# Patient Record
Sex: Female | Born: 1960
Health system: Southern US, Community
[De-identification: ages and names within clinical notes are randomized; demographics above are authoritative.]

## PROBLEM LIST (undated history)

## (undated) DIAGNOSIS — J45909 Unspecified asthma, uncomplicated: Secondary | ICD-10-CM

## (undated) HISTORY — PX: FOOT SURGERY: SHX648

---

## 2007-06-07 ENCOUNTER — Other Ambulatory Visit: Payer: Self-pay

## 2007-06-07 ENCOUNTER — Inpatient Hospital Stay: Payer: Self-pay | Admitting: *Deleted

## 2009-10-27 ENCOUNTER — Ambulatory Visit: Payer: Self-pay | Admitting: Anesthesiology

## 2009-11-01 ENCOUNTER — Ambulatory Visit: Payer: Self-pay | Admitting: Podiatry

## 2013-04-19 ENCOUNTER — Ambulatory Visit: Payer: Self-pay | Admitting: Physician Assistant

## 2013-04-19 LAB — RAPID INFLUENZA A&B ANTIGENS

## 2016-05-21 ENCOUNTER — Other Ambulatory Visit: Payer: Self-pay | Admitting: Orthopedic Surgery

## 2016-05-21 DIAGNOSIS — M25562 Pain in left knee: Secondary | ICD-10-CM

## 2016-06-20 ENCOUNTER — Other Ambulatory Visit: Payer: Self-pay | Admitting: Internal Medicine

## 2016-06-20 DIAGNOSIS — R05 Cough: Secondary | ICD-10-CM

## 2016-06-20 DIAGNOSIS — R059 Cough, unspecified: Secondary | ICD-10-CM

## 2016-07-04 ENCOUNTER — Ambulatory Visit: Payer: 59

## 2016-07-09 ENCOUNTER — Ambulatory Visit
Admission: RE | Admit: 2016-07-09 | Discharge: 2016-07-09 | Disposition: A | Discharge status: Home / Self Care | Payer: Commercial Managed Care - HMO | Source: Ambulatory Visit | Attending: Internal Medicine | Admitting: Internal Medicine

## 2016-07-09 DIAGNOSIS — R918 Other nonspecific abnormal finding of lung field: Secondary | ICD-10-CM | POA: Diagnosis not present

## 2016-07-09 DIAGNOSIS — R0602 Shortness of breath: Secondary | ICD-10-CM | POA: Insufficient documentation

## 2016-07-09 DIAGNOSIS — R059 Cough, unspecified: Secondary | ICD-10-CM

## 2016-07-09 DIAGNOSIS — R05 Cough: Secondary | ICD-10-CM | POA: Insufficient documentation

## 2016-07-09 HISTORY — DX: Unspecified asthma, uncomplicated: J45.909

## 2016-07-09 MED ORDER — IOPAMIDOL (ISOVUE-300) INJECTION 61%
75.0000 mL | Freq: Once | INTRAVENOUS | Status: AC | PRN
  Administered 2016-07-09: 75 mL via INTRAVENOUS

## 2016-08-29 ENCOUNTER — Other Ambulatory Visit: Payer: Self-pay | Admitting: Internal Medicine

## 2016-08-29 DIAGNOSIS — R911 Solitary pulmonary nodule: Secondary | ICD-10-CM

## 2016-09-18 ENCOUNTER — Ambulatory Visit
Admission: RE | Admit: 2016-09-18 | Discharge: 2016-09-18 | Disposition: A | Discharge status: Home / Self Care | Payer: 59 | Source: Ambulatory Visit | Attending: Internal Medicine | Admitting: Internal Medicine

## 2016-10-01 ENCOUNTER — Encounter: Payer: Self-pay | Admitting: Radiology

## 2016-10-01 ENCOUNTER — Ambulatory Visit
Admission: RE | Admit: 2016-10-01 | Discharge: 2016-10-01 | Disposition: A | Discharge status: Home / Self Care | Payer: Commercial Managed Care - HMO | Source: Ambulatory Visit | Attending: Internal Medicine | Admitting: Internal Medicine

## 2016-10-01 DIAGNOSIS — R911 Solitary pulmonary nodule: Secondary | ICD-10-CM | POA: Diagnosis present

## 2016-10-01 DIAGNOSIS — R918 Other nonspecific abnormal finding of lung field: Secondary | ICD-10-CM | POA: Insufficient documentation

## 2016-10-01 MED ORDER — IOPAMIDOL (ISOVUE-300) INJECTION 61%
75.0000 mL | Freq: Once | INTRAVENOUS | Status: AC | PRN
  Administered 2016-10-01: 75 mL via INTRAVENOUS

## 2017-04-15 ENCOUNTER — Ambulatory Visit
Admission: RE | Admit: 2017-04-15 | Discharge: 2017-04-15 | Disposition: A | Discharge status: Home / Self Care | Payer: 59 | Source: Ambulatory Visit | Attending: Family Medicine | Admitting: Family Medicine

## 2017-04-15 ENCOUNTER — Other Ambulatory Visit: Payer: Self-pay | Admitting: Family Medicine

## 2017-04-15 DIAGNOSIS — M79671 Pain in right foot: Secondary | ICD-10-CM

## 2017-04-15 DIAGNOSIS — S92354A Nondisplaced fracture of fifth metatarsal bone, right foot, initial encounter for closed fracture: Secondary | ICD-10-CM | POA: Diagnosis not present

## 2017-04-15 DIAGNOSIS — X58XXXA Exposure to other specified factors, initial encounter: Secondary | ICD-10-CM | POA: Insufficient documentation

## 2017-05-06 ENCOUNTER — Ambulatory Visit
Admission: RE | Admit: 2017-05-06 | Discharge: 2017-05-06 | Disposition: A | Discharge status: Home / Self Care | Payer: 59 | Source: Ambulatory Visit | Attending: Family Medicine | Admitting: Family Medicine

## 2017-05-06 ENCOUNTER — Other Ambulatory Visit: Payer: Self-pay | Admitting: Family Medicine

## 2017-05-06 DIAGNOSIS — M79671 Pain in right foot: Secondary | ICD-10-CM | POA: Diagnosis not present

## 2017-05-06 DIAGNOSIS — X58XXXA Exposure to other specified factors, initial encounter: Secondary | ICD-10-CM | POA: Diagnosis not present

## 2017-05-06 DIAGNOSIS — S92351A Displaced fracture of fifth metatarsal bone, right foot, initial encounter for closed fracture: Secondary | ICD-10-CM | POA: Diagnosis not present

## 2017-06-05 ENCOUNTER — Ambulatory Visit: Payer: Self-pay | Admitting: Internal Medicine

## 2017-06-26 ENCOUNTER — Ambulatory Visit: Payer: 59 | Admitting: Internal Medicine

## 2017-06-26 ENCOUNTER — Encounter: Payer: Self-pay | Admitting: Internal Medicine

## 2017-06-26 VITALS — BP 129/78 | HR 81 | Resp 16 | Ht 68.0 in | Wt 170.1 lb

## 2017-06-26 DIAGNOSIS — J449 Chronic obstructive pulmonary disease, unspecified: Secondary | ICD-10-CM | POA: Diagnosis not present

## 2017-06-26 DIAGNOSIS — R911 Solitary pulmonary nodule: Secondary | ICD-10-CM

## 2017-06-26 DIAGNOSIS — R0602 Shortness of breath: Secondary | ICD-10-CM

## 2017-06-26 NOTE — Progress Notes (Signed)
[facility] [address] [place], [place] [address]  Pulmonary Sleep Medicine  Office Visit Note  Patient Name: Raven Ayers DOB: [birth year] MRN [id number]  Date of Service: 06/26/2017  Complaints/HPI:  She states that she is doing well she has been using the inhalers as prescribed she is currently on trilogy.  She has had no issues with side effects.  Once again we spoke about doing a complete pulmonary function studies she does not want to do this.  She did have a spirometry done here in the office that shows mild reduction in the FEV1 sewed BP consistent with mild obstructive lung disease.  She currently is not smoking.  She has little bit of a cough occasional shortness of breath this noted.  ROS  General: (-) fever, (-) chills, (-) night sweats, (-) weakness Skin: (-) rashes, (-) itching,. Eyes: (-) visual changes, (-) redness, (-) itching. Nose and Sinuses: (-) nasal stuffiness or itchiness, (-) postnasal drip, (-) nosebleeds, (-) sinus trouble. Mouth and Throat: (-) sore throat, (-) hoarseness. Neck: (-) swollen glands, (-) enlarged thyroid, (-) neck pain. Respiratory: + cough, (-) bloody sputum, + shortness of breath, - wheezing. Cardiovascular: - ankle swelling, (-) chest pain. Lymphatic: (-) lymph node enlargement. Neurologic: (-) numbness, (-) tingling. Psychiatric: (-) anxiety, (-) depression   Current Medication: Outpatient Encounter Medications as of 06/26/2017  Medication Sig  . albuterol (VENTOLIN HFA) 108 (90 Base) MCG/ACT inhaler INHALE 2 PUFFS PO Q 4 H PRF WHEEZING.  Marland Kitchen Fluticasone-Umeclidin-Vilant (TRELEGY ELLIPTA) 100-62.5-25 MCG/INH AEPB    No facility-administered encounter medications on file as of 06/26/2017.     Surgical History: Past Surgical History:  Procedure Laterality Date  . FOOT SURGERY Left     Medical History: Past Medical History:  Diagnosis Date  . Asthma     Family History: No family history on file.  Social  History: Social History   Socioeconomic History  . Marital status: Widowed    Spouse name: Not on file  . Number of children: Not on file  . Years of education: Not on file  . Highest education level: Not on file  Occupational History  . Not on file  Social Needs  . Financial resource strain: Not on file  . Food insecurity:    Worry: Not on file    Inability: Not on file  . Transportation needs:    Medical: Not on file    Non-medical: Not on file  Tobacco Use  . Smoking status: Former Smoker    Types: Cigarettes  . Smokeless tobacco: Never Used  . Tobacco comment: 10 years pt quit  Substance and Sexual Activity  . Alcohol use: Yes    Comment: social  . Drug use: Not on file  . Sexual activity: Not on file  Lifestyle  . Physical activity:    Days per week: Not on file    Minutes per session: Not on file  . Stress: Not on file  Relationships  . Social connections:    Talks on phone: Not on file    Gets together: Not on file    Attends religious service: Not on file    Active member of club or organization: Not on file    Attends meetings of clubs or organizations: Not on file    Relationship status: Not on file  . Intimate partner violence:    Fear of current or ex partner: Not on file    Emotionally abused: Not on file  Physically abused: Not on file    Forced sexual activity: Not on file  Other Topics Concern  . Not on file  Social History Narrative  . Not on file    Vital Signs: Blood pressure 129/78, pulse 81, resp. rate 16, height 5\' 8"  (1.727 m), weight 170 lb 1.6 oz (77.2 kg), SpO2 96 %, unknown if currently breastfeeding.  Examination: General Appearance: The patient is well-developed, well-nourished, and in no distress. Skin: Gross inspection of skin unremarkable. Head: normocephalic, no gross deformities. Eyes: no gross deformities noted. ENT: ears appear grossly normal no exudates. Neck: Supple. No thyromegaly. No LAD. Respiratory: clear at  this time. Cardiovascular: Normal S1 and S2 without murmur or rub. Extremities: No cyanosis. pulses are equal. Neurologic: Alert and oriented. No involuntary movements.  LABS: No results found for this or any previous visit (from the past 2160 hour(s)).  Radiology: Dg Foot Complete Right  Result Date: 05/06/2017 CLINICAL DATA:  Follow-up foot fracture EXAM: RIGHT FOOT COMPLETE - 3+ VIEW COMPARISON:  None. FINDINGS: Healing fracture at the base of the 5th metatarsal. Fracture lucency remains minimally visible. Hallux valgus deformity with degenerative changes of the 1st MTP joint. Visualized soft tissues are within normal limits. IMPRESSION: Healing fracture at the base of the 5th metatarsal. Fracture lucency remains minimally visible. Electronically Signed   By: Charline BillsSriyesh  Krishnan M.D.   On: 05/06/2017 17:09    No results found.  No results found.    Assessment and Plan: There are no active problems to display for this patient.   1.   COPD we will continue with full supportive care continue with trilogy as ordered 2. Cough at baseline not really any worse 3. Solitary pulmonary nodule this has resolved on the last CT scan will continue to monitor periodically 4. Remote tobacco  General Counseling: I have discussed the findings of the evaluation and examination with Lupita LeashDonna.  I have also discussed any further diagnostic evaluation thatmay be needed or ordered today. Lupita LeashDonna verbalizes understanding of the findings of todays visit. We also reviewed her medications today and discussed drug interactions and side effects including but not limited excessive drowsiness and altered mental states. We also discussed that there is always a risk not just to her but also people around her. she has been encouraged to call the office with any questions or concerns that should arise related to todays visit.    Time spent:  15 min  I have personally obtained a history, examined the patient, evaluated  laboratory and imaging results, formulated the assessment and plan and placed orders.    Yevonne PaxSaadat A Czar Ysaguirre, MD [facility] Pulmonary and Critical Care Sleep medicine

## 2017-06-26 NOTE — Patient Instructions (Signed)

## 2017-11-11 ENCOUNTER — Other Ambulatory Visit: Payer: Self-pay | Admitting: Internal Medicine

## 2017-12-29 ENCOUNTER — Ambulatory Visit: Payer: Self-pay | Admitting: Internal Medicine

## 2018-01-19 ENCOUNTER — Ambulatory Visit: Payer: 59 | Admitting: Adult Health

## 2018-01-19 ENCOUNTER — Encounter: Payer: Self-pay | Admitting: Adult Health

## 2018-01-19 VITALS — BP 124/68 | HR 84 | Resp 16 | Ht 66.0 in | Wt 170.0 lb

## 2018-01-19 DIAGNOSIS — R0602 Shortness of breath: Secondary | ICD-10-CM | POA: Diagnosis not present

## 2018-01-19 DIAGNOSIS — I10 Essential (primary) hypertension: Secondary | ICD-10-CM

## 2018-01-19 DIAGNOSIS — J449 Chronic obstructive pulmonary disease, unspecified: Secondary | ICD-10-CM

## 2018-01-19 DIAGNOSIS — Z23 Encounter for immunization: Secondary | ICD-10-CM | POA: Diagnosis not present

## 2018-01-19 MED ORDER — FLUTICASONE-UMECLIDIN-VILANT 100-62.5-25 MCG/INH IN AEPB: 1.0000 | INHALATION_SPRAY | Freq: Every day | RESPIRATORY_TRACT | 6 refills | Status: DC

## 2018-01-19 MED ORDER — ALBUTEROL SULFATE HFA 108 (90 BASE) MCG/ACT IN AERS: 2.0000 | INHALATION_SPRAY | Freq: Four times a day (QID) | RESPIRATORY_TRACT | 6 refills | Status: DC | PRN

## 2018-01-19 NOTE — Progress Notes (Signed)
[facility] [address] [place], [place] [address]  Pulmonary Sleep Medicine   Office Visit Note  Patient Name: Raven Ayers DOB: 1961-02-06 MRN [id number]  Date of Service: 01/19/2018  Complaints/HPI: Patient here for six-month pulmonary follow-up.  She reports he general been doing well.  She continues to use her inhaler as prescribed.  She reports not having to use her rescue inhaler very often, mostly with weather changes.  Some treatment in the office today she continues to show mild reduction in FEV1, mild obstructive lung disease.  She quit smoking 10 years ago and started back.  She does continue to report a small of coughing but denies fever or shortness of breath at this time.  ROS  General: (-) fever, (-) chills, (-) night sweats, (-) weakness Skin: (-) rashes, (-) itching,. Eyes: (-) visual changes, (-) redness, (-) itching. Nose and Sinuses: (-) nasal stuffiness or itchiness, (-) postnasal drip, (-) nosebleeds, (-) sinus trouble. Mouth and Throat: (-) sore throat, (-) hoarseness. Neck: (-) swollen glands, (-) enlarged thyroid, (-) neck pain. Respiratory: + cough, (-) bloody sputum, - shortness of breath, - wheezing. Cardiovascular: - ankle swelling, (-) chest pain. Lymphatic: (-) lymph node enlargement. Neurologic: (-) numbness, (-) tingling. Psychiatric: (-) anxiety, (-) depression   Current Medication: Outpatient Encounter Medications as of 01/19/2018  Medication Sig  . albuterol (VENTOLIN HFA) 108 (90 Base) MCG/ACT inhaler Inhale 2 puffs into the lungs every 6 (six) hours as needed for wheezing or shortness of breath.  . Fluticasone-Umeclidin-Vilant (TRELEGY ELLIPTA) 100-62.5-25 MCG/INH AEPB Inhale 1 puff into the lungs daily.  . [DISCONTINUED] albuterol (VENTOLIN HFA) 108 (90 Base) MCG/ACT inhaler INHALE 2 PUFFS PO Q 4 H PRF WHEEZING.  . [DISCONTINUED] TRELEGY ELLIPTA 100-62.5-25 MCG/INH AEPB INHALE 1 PUFF BY MOUTH DAILY   No  facility-administered encounter medications on file as of 01/19/2018.     Surgical History: Past Surgical History:  Procedure Laterality Date  . FOOT SURGERY Left     Medical History: Past Medical History:  Diagnosis Date  . Asthma     Family History: Family History  Problem Relation Age of Onset  . Cancer Mother   . Alzheimer's disease Father     Social History: Social History   Socioeconomic History  . Marital status: Widowed    Spouse name: Not on file  . Number of children: Not on file  . Years of education: Not on file  . Highest education level: Not on file  Occupational History  . Not on file  Social Needs  . Financial resource strain: Not on file  . Food insecurity:    Worry: Not on file    Inability: Not on file  . Transportation needs:    Medical: Not on file    Non-medical: Not on file  Tobacco Use  . Smoking status: Former Smoker    Types: Cigarettes  . Smokeless tobacco: Never Used  . Tobacco comment: 10 years pt quit  Substance and Sexual Activity  . Alcohol use: Yes    Comment: social  . Drug use: Not on file  . Sexual activity: Not on file  Lifestyle  . Physical activity:    Days per week: Not on file    Minutes per session: Not on file  . Stress: Not on file  Relationships  . Social connections:    Talks on phone: Not on file    Gets together: Not on file    Attends religious service: Not on file  Active member of club or organization: Not on file    Attends meetings of clubs or organizations: Not on file    Relationship status: Not on file  . Intimate partner violence:    Fear of current or ex partner: Not on file    Emotionally abused: Not on file    Physically abused: Not on file    Forced sexual activity: Not on file  Other Topics Concern  . Not on file  Social History Narrative  . Not on file    Vital Signs: Blood pressure 124/68, pulse 84, resp. rate 16, height 5\' 6"  (1.676 m), weight 170 lb (77.1 kg), SpO2 92 %,  unknown if currently breastfeeding.  Examination: General Appearance: The patient is well-developed, well-nourished, and in no distress. Skin: Gross inspection of skin unremarkable. Head: normocephalic, no gross deformities. Eyes: no gross deformities noted. ENT: ears appear grossly normal no exudates. Neck: Supple. No thyromegaly. No LAD. Respiratory: Clear to auscultation bilaterally. Cardiovascular: Normal S1 and S2 without murmur or rub. Extremities: No cyanosis. pulses are equal. Neurologic: Alert and oriented. No involuntary movements.  LABS: No results found for this or any previous visit (from the past 2160 hour(s)).  Radiology: Dg Foot Complete Right  Result Date: 05/06/2017 CLINICAL DATA:  Follow-up foot fracture EXAM: RIGHT FOOT COMPLETE - 3+ VIEW COMPARISON:  None. FINDINGS: Healing fracture at the base of the 5th metatarsal. Fracture lucency remains minimally visible. Hallux valgus deformity with degenerative changes of the 1st MTP joint. Visualized soft tissues are within normal limits. IMPRESSION: Healing fracture at the base of the 5th metatarsal. Fracture lucency remains minimally visible. Electronically Signed   By: Charline Bills M.D.   On: 05/06/2017 17:09    No results found.  No results found.    Assessment and Plan: There are no active problems to display for this patient.   1. Chronic obstructive pulmonary disease, unspecified COPD type (HCC) Continue with current treatment regimen.  Discussed evaluating pulmonary function test with patient, she continues to decline at this time.  Refilled patient's inhalers. - Fluticasone-Umeclidin-Vilant (TRELEGY ELLIPTA) 100-62.5-25 MCG/INH AEPB; Inhale 1 puff into the lungs daily.  Dispense: 1 each; Refill: 6 - albuterol (VENTOLIN HFA) 108 (90 Base) MCG/ACT inhaler; Inhale 2 puffs into the lungs every 6 (six) hours as needed for wheezing or shortness of breath.  Dispense: 1 Inhaler; Refill: 6  2. Essential  hypertension Controlled currently, continue therapy.  3. Flu vaccine need - Flu Vaccine MDCK QUAD PF  4. SOB (shortness of breath) FEV1 FVC ratio had a pre-actual value 68% and a previous value of 86% on today's spirometry.  This is unchanged since last spirometry in March of this year. - Spirometry with Graph  General Counseling: I have discussed the findings of the evaluation and examination with Rochel.  I have also discussed any further diagnostic evaluation thatmay be needed or ordered today. Nathalee verbalizes understanding of the findings of todays visit. We also reviewed her medications today and discussed drug interactions and side effects including but not limited excessive drowsiness and altered mental states. We also discussed that there is always a risk not just to her but also people around her. she has been encouraged to call the office with any questions or concerns that should arise related to todays visit.    Time spent: 20 This patient was seen by Blima Ledger AGNP-C in Collaboration with Dr. Freda Munro as a part of collaborative care agreement.   I have personally obtained a  history, examined the patient, evaluated laboratory and imaging results, formulated the assessment and plan and placed orders.    Allyne Gee, MD [facility] Pulmonary and Critical Care Sleep medicine

## 2018-01-19 NOTE — Patient Instructions (Signed)

## 2018-07-21 ENCOUNTER — Ambulatory Visit: Payer: Self-pay | Admitting: Internal Medicine

## 2018-08-10 ENCOUNTER — Ambulatory Visit: Payer: Self-pay | Admitting: Nurse Practitioner

## 2018-08-10 ENCOUNTER — Other Ambulatory Visit: Payer: Self-pay | Admitting: Adult Health

## 2018-08-10 DIAGNOSIS — J449 Chronic obstructive pulmonary disease, unspecified: Secondary | ICD-10-CM

## 2018-09-21 ENCOUNTER — Other Ambulatory Visit: Payer: Self-pay | Admitting: Adult Health

## 2018-09-21 DIAGNOSIS — J449 Chronic obstructive pulmonary disease, unspecified: Secondary | ICD-10-CM

## 2018-09-28 ENCOUNTER — Encounter: Payer: Self-pay | Admitting: Internal Medicine

## 2018-09-28 ENCOUNTER — Other Ambulatory Visit: Payer: Self-pay

## 2018-09-28 ENCOUNTER — Ambulatory Visit: Payer: 59 | Admitting: Internal Medicine

## 2018-09-28 VITALS — Ht 66.0 in | Wt 166.1 lb

## 2018-09-28 DIAGNOSIS — I10 Essential (primary) hypertension: Secondary | ICD-10-CM

## 2018-09-28 DIAGNOSIS — J449 Chronic obstructive pulmonary disease, unspecified: Secondary | ICD-10-CM

## 2018-09-28 MED ORDER — TRELEGY ELLIPTA 100-62.5-25 MCG/INH IN AEPB: 1.0000 | INHALATION_SPRAY | Freq: Every day | RESPIRATORY_TRACT | 6 refills | Status: DC

## 2018-09-28 MED ORDER — ALBUTEROL SULFATE HFA 108 (90 BASE) MCG/ACT IN AERS: 2.0000 | INHALATION_SPRAY | Freq: Four times a day (QID) | RESPIRATORY_TRACT | 3 refills | Status: DC | PRN

## 2018-09-28 NOTE — Progress Notes (Signed)
[facility] [address] [place], [place] [id number]  Internal MEDICINE  Telephone Visit  Patient Name: Raven Ayers  [id number]  [id number]  Date of Service: 09/28/2018  I connected with the patient at 1200 by telephone and verified the patients identity using two identifiers.   I discussed the limitations, risks, security and privacy concerns of performing an evaluation and management service by telephone and the availability of in person appointments. I also discussed with the patient that there may be a patient responsible charge related to the service.  The patient expressed understanding and agrees to proceed.    Chief Complaint  Patient presents with  . Telephone Assessment  . Telephone Screen  . Follow-up  . Asthma    HPI Pt is seen via video for follow up.  She reports she is doing well.  Denies any current issues.  No hospitalizations, no fever, sob, cough or other symptoms. She is using her inhalers and is requesting refills at this time.     Current Medication: Outpatient Encounter Medications as of 09/28/2018  Medication Sig  . albuterol (VENTOLIN HFA) 108 (90 Base) MCG/ACT inhaler Inhale 2 puffs into the lungs every 6 (six) hours as needed for wheezing or shortness of breath.  . Fluticasone-Umeclidin-Vilant (TRELEGY ELLIPTA) 100-62.5-25 MCG/INH AEPB Inhale 1 puff into the lungs daily.  . [DISCONTINUED] albuterol (VENTOLIN HFA) 108 (90 Base) MCG/ACT inhaler Inhale 2 puffs into the lungs every 6 (six) hours as needed for wheezing or shortness of breath.  . [DISCONTINUED] TRELEGY ELLIPTA 100-62.5-25 MCG/INH AEPB INHALE 1 PUFF INTO THE LUNGS DAILY   No facility-administered encounter medications on file as of 09/28/2018.     Surgical History: Past Surgical History:  Procedure Laterality Date  . FOOT SURGERY Left     Medical History: Past Medical History:  Diagnosis Date  . Asthma     Family History: Family History  Problem Relation Age of Onset   . Cancer Mother   . Alzheimer's disease Father     Social History   Socioeconomic History  . Marital status: Widowed    Spouse name: Not on file  . Number of children: Not on file  . Years of education: Not on file  . Highest education level: Not on file  Occupational History  . Not on file  Social Needs  . Financial resource strain: Not on file  . Food insecurity    Worry: Not on file    Inability: Not on file  . Transportation needs    Medical: Not on file    Non-medical: Not on file  Tobacco Use  . Smoking status: Former Smoker    Types: Cigarettes  . Smokeless tobacco: Never Used  . Tobacco comment: 10 years pt quit  Substance and Sexual Activity  . Alcohol use: Yes    Comment: social  . Drug use: Not on file  . Sexual activity: Not on file  Lifestyle  . Physical activity    Days per week: Not on file    Minutes per session: Not on file  . Stress: Not on file  Relationships  . Social Musicianconnections    Talks on phone: Not on file    Gets together: Not on file    Attends religious service: Not on file    Active member of club or organization: Not on file    Attends meetings of clubs or organizations: Not on file    Relationship status: Not on file  . Intimate partner  violence    Fear of current or ex partner: Not on file    Emotionally abused: Not on file    Physically abused: Not on file    Forced sexual activity: Not on file  Other Topics Concern  . Not on file  Social History Narrative  . Not on file      Review of Systems  Constitutional: Negative for chills, fatigue and unexpected weight change.  HENT: Negative for congestion, rhinorrhea, sneezing and sore throat.   Eyes: Negative for photophobia, pain and redness.  Respiratory: Negative for cough, chest tightness and shortness of breath.   Cardiovascular: Negative for chest pain and palpitations.  Gastrointestinal: Negative for abdominal pain, constipation, diarrhea, nausea and vomiting.   Endocrine: Negative.   Genitourinary: Negative for dysuria and frequency.  Musculoskeletal: Negative for arthralgias, back pain, joint swelling and neck pain.  Skin: Negative for rash.  Allergic/Immunologic: Negative.   Neurological: Negative for tremors and numbness.  Hematological: Negative for adenopathy. Does not bruise/bleed easily.  Psychiatric/Behavioral: Negative for behavioral problems and sleep disturbance. The patient is not nervous/anxious.     Vital Signs: Ht 5\' 6"  (1.676 m)   Wt 166 lb 1.6 oz (75.3 kg)   BMI 26.81 kg/m    Observation/Objective: Well appearing, NAD at this time.     Assessment/Plan: 1. Chronic obstructive pulmonary disease, unspecified COPD type ([place]) Overall patient is doing well.  Refilled her inhalers at this time. We also discussed PFT's she has never had then done in our office.  She typically refuses due to her first experience in the hospital was so miserable.  We discussed the logistics of the new PFT machine,and she says she will think about it for her next visit.   - Fluticasone-Umeclidin-Vilant (TRELEGY ELLIPTA) 100-62.5-25 MCG/INH AEPB; Inhale 1 puff into the lungs daily.  Dispense: 60 each; Refill: 6 - albuterol (VENTOLIN HFA) 108 (90 Base) MCG/ACT inhaler; Inhale 2 puffs into the lungs every 6 (six) hours as needed for wheezing or shortness of breath.  Dispense: 54 g; Refill: 3  2. Essential hypertension Stable, continue present management.   General Counseling: rubie ficco understanding of the findings of today's phone visit and agrees with plan of treatment. I have discussed any further diagnostic evaluation that may be needed or ordered today. We also reviewed her medications today. she has been encouraged to call the office with any questions or concerns that should arise related to todays visit.    No orders of the defined types were placed in this encounter.   Meds ordered this encounter  Medications  .  Fluticasone-Umeclidin-Vilant (TRELEGY ELLIPTA) 100-62.5-25 MCG/INH AEPB    Sig: Inhale 1 puff into the lungs daily.    Dispense:  60 each    Refill:  6  . albuterol (VENTOLIN HFA) 108 (90 Base) MCG/ACT inhaler    Sig: Inhale 2 puffs into the lungs every 6 (six) hours as needed for wheezing or shortness of breath.    Dispense:  54 g    Refill:  3    3 inhalers, (90 day supply please)    Time spent: 12 Minutes    Orson Gear AGNP-C Internal medicine

## 2019-03-31 ENCOUNTER — Telehealth: Payer: Self-pay

## 2019-03-31 NOTE — Telephone Encounter (Signed)
Confirmed appointment with patient. klh °

## 2019-04-05 ENCOUNTER — Ambulatory Visit: Payer: 59 | Admitting: Internal Medicine

## 2019-04-05 ENCOUNTER — Other Ambulatory Visit: Payer: Self-pay

## 2019-04-05 ENCOUNTER — Encounter: Payer: Self-pay | Admitting: Internal Medicine

## 2019-04-05 VITALS — Ht 67.0 in | Wt 168.0 lb

## 2019-04-05 DIAGNOSIS — J449 Chronic obstructive pulmonary disease, unspecified: Secondary | ICD-10-CM

## 2019-04-05 DIAGNOSIS — I10 Essential (primary) hypertension: Secondary | ICD-10-CM | POA: Diagnosis not present

## 2019-04-05 MED ORDER — ALBUTEROL SULFATE HFA 108 (90 BASE) MCG/ACT IN AERS: 2.0000 | INHALATION_SPRAY | Freq: Four times a day (QID) | RESPIRATORY_TRACT | 3 refills | Status: DC | PRN

## 2019-04-05 MED ORDER — TRELEGY ELLIPTA 100-62.5-25 MCG/INH IN AEPB: 1.0000 | INHALATION_SPRAY | Freq: Every day | RESPIRATORY_TRACT | 6 refills | Status: DC

## 2019-04-05 NOTE — Progress Notes (Signed)
[facility] [place], [place] [address]  Internal MEDICINE  Telephone Visit  Patient Name: Raven Ayers  [id number]  [id number]  Date of Service: 04/05/2019  I connected with the patient at 1251 by telephone and verified the patients identity using two identifiers.   I discussed the limitations, risks, security and privacy concerns of performing an evaluation and management service by telephone and the availability of in person appointments. I also discussed with the patient that there may be a patient responsible charge related to the service.  The patient expressed understanding and agrees to proceed.    Chief Complaint  Patient presents with  . Telephone Assessment  . Telephone Screen  . Asthma    HPI  Pt is seen via video for follow up.  She reports she is doing well.   No hospitalizations, no fever, sob, cough or other symptoms. She is using her inhalers and is requesting refills at this time. She is currently using trelegy daily.  She reports using her rescue inhaler mostly when the weather changes, and at most she uses it every 3 days at that time.    Current Medication: Outpatient Encounter Medications as of 04/05/2019  Medication Sig  . albuterol (VENTOLIN HFA) 108 (90 Base) MCG/ACT inhaler Inhale 2 puffs into the lungs every 6 (six) hours as needed for wheezing or shortness of breath.  . Fluticasone-Umeclidin-Vilant (TRELEGY ELLIPTA) 100-62.5-25 MCG/INH AEPB Inhale 1 puff into the lungs daily.  . [DISCONTINUED] albuterol (VENTOLIN HFA) 108 (90 Base) MCG/ACT inhaler Inhale 2 puffs into the lungs every 6 (six) hours as needed for wheezing or shortness of breath.  . [DISCONTINUED] Fluticasone-Umeclidin-Vilant (TRELEGY ELLIPTA) 100-62.5-25 MCG/INH AEPB Inhale 1 puff into the lungs daily.   No facility-administered encounter medications on file as of 04/05/2019.    Surgical History: Past Surgical History:  Procedure Laterality Date  . FOOT  SURGERY Left     Medical History: Past Medical History:  Diagnosis Date  . Asthma     Family History: Family History  Problem Relation Age of Onset  . Cancer Mother   . Alzheimer's disease Father     Social History   Socioeconomic History  . Marital status: Widowed    Spouse name: Not on file  . Number of children: Not on file  . Years of education: Not on file  . Highest education level: Not on file  Occupational History  . Not on file  Tobacco Use  . Smoking status: Former Smoker    Types: Cigarettes  . Smokeless tobacco: Never Used  . Tobacco comment: 10 years pt quit  Substance and Sexual Activity  . Alcohol use: Yes    Comment: social  . Drug use: Never  . Sexual activity: Not on file  Other Topics Concern  . Not on file  Social History Narrative  . Not on file   Social Determinants of Health   Financial Resource Strain:   . Difficulty of Paying Living Expenses: Not on file  Food Insecurity:   . Worried About Charity fundraiser in the Last Year: Not on file  . Ran Out of Food in the Last Year: Not on file  Transportation Needs:   . Lack of Transportation (Medical): Not on file  . Lack of Transportation (Non-Medical): Not on file  Physical Activity:   . Days of Exercise per Week: Not on file  . Minutes of Exercise per Session: Not on file  Stress:   . Feeling  of Stress : Not on file  Social Connections:   . Frequency of Communication with Friends and Family: Not on file  . Frequency of Social Gatherings with Friends and Family: Not on file  . Attends Religious Services: Not on file  . Active Member of Clubs or Organizations: Not on file  . Attends Banker Meetings: Not on file  . Marital Status: Not on file  Intimate Partner Violence:   . Fear of Current or Ex-Partner: Not on file  . Emotionally Abused: Not on file  . Physically Abused: Not on file  . Sexually Abused: Not on file      Review of Systems  Constitutional:  Negative for chills, fatigue and unexpected weight change.  HENT: Negative for congestion, rhinorrhea, sneezing and sore throat.   Eyes: Negative for photophobia, pain and redness.  Respiratory: Negative for cough, chest tightness and shortness of breath.   Cardiovascular: Negative for chest pain and palpitations.  Gastrointestinal: Negative for abdominal pain, constipation, diarrhea, nausea and vomiting.  Endocrine: Negative.   Genitourinary: Negative for dysuria and frequency.  Musculoskeletal: Negative for arthralgias, back pain, joint swelling and neck pain.  Skin: Negative for rash.  Allergic/Immunologic: Negative.   Neurological: Negative for tremors and numbness.  Hematological: Negative for adenopathy. Does not bruise/bleed easily.  Psychiatric/Behavioral: Negative for behavioral problems and sleep disturbance. The patient is not nervous/anxious.     Vital Signs: Ht 5\' 7"  (1.702 m)   Wt 168 lb (76.2 kg)   BMI 26.31 kg/m    Observation/Objective:  Well sounding, NAD noted.    Assessment/Plan: 1. Chronic obstructive pulmonary disease, unspecified COPD type (HCC) Continue to use flonase and Albuterol as prescribed.  - Fluticasone-Umeclidin-Vilant (TRELEGY ELLIPTA) 100-62.5-25 MCG/INH AEPB; Inhale 1 puff into the lungs daily.  Dispense: 60 each; Refill: 6 - albuterol (VENTOLIN HFA) 108 (90 Base) MCG/ACT inhaler; Inhale 2 puffs into the lungs every 6 (six) hours as needed for wheezing or shortness of breath.  Dispense: 54 g; Refill: 3 - Pulmonary function test; Future  2. Essential hypertension Controlled, continue present management.   General Counseling: Raven Ayers understanding of the findings of today's phone visit and agrees with plan of treatment. I have discussed any further diagnostic evaluation that may be needed or ordered today. We also reviewed her medications today. she has been encouraged to call the office with any questions or concerns that should arise  related to todays visit.    Orders Placed This Encounter  Procedures  . Pulmonary function test    Meds ordered this encounter  Medications  . Fluticasone-Umeclidin-Vilant (TRELEGY ELLIPTA) 100-62.5-25 MCG/INH AEPB    Sig: Inhale 1 puff into the lungs daily.    Dispense:  60 each    Refill:  6  . albuterol (VENTOLIN HFA) 108 (90 Base) MCG/ACT inhaler    Sig: Inhale 2 puffs into the lungs every 6 (six) hours as needed for wheezing or shortness of breath.    Dispense:  54 g    Refill:  3    3 inhalers, (90 day supply please)    Time spent: 15 Minutes    05-07-1983 AGNP-C Pulmonary medicine

## 2019-04-19 ENCOUNTER — Telehealth: Payer: Self-pay

## 2019-04-19 NOTE — Telephone Encounter (Signed)
Patient cancelled appointment on 04/21/2019 due to new baby in family also stated does not want to reschedule does not want to do pft. klh

## 2019-04-21 ENCOUNTER — Ambulatory Visit: Payer: 59 | Admitting: Internal Medicine

## 2019-04-22 ENCOUNTER — Encounter: Payer: Self-pay | Admitting: Internal Medicine

## 2019-04-22 NOTE — Telephone Encounter (Signed)
Hey. I got this message in my inbox

## 2019-05-10 ENCOUNTER — Ambulatory Visit: Payer: 59 | Admitting: Internal Medicine

## 2019-05-16 IMAGING — CR DG FOOT 2V*R*
2 series · 2 of 2 positions shown · non-contrast
Comparison: None.

CLINICAL DATA: Hx fracture in base of 5th MT bone x 5 weeks ago.
F/u xray

EXAM:
RIGHT FOOT - 2 VIEW

[foot ap]
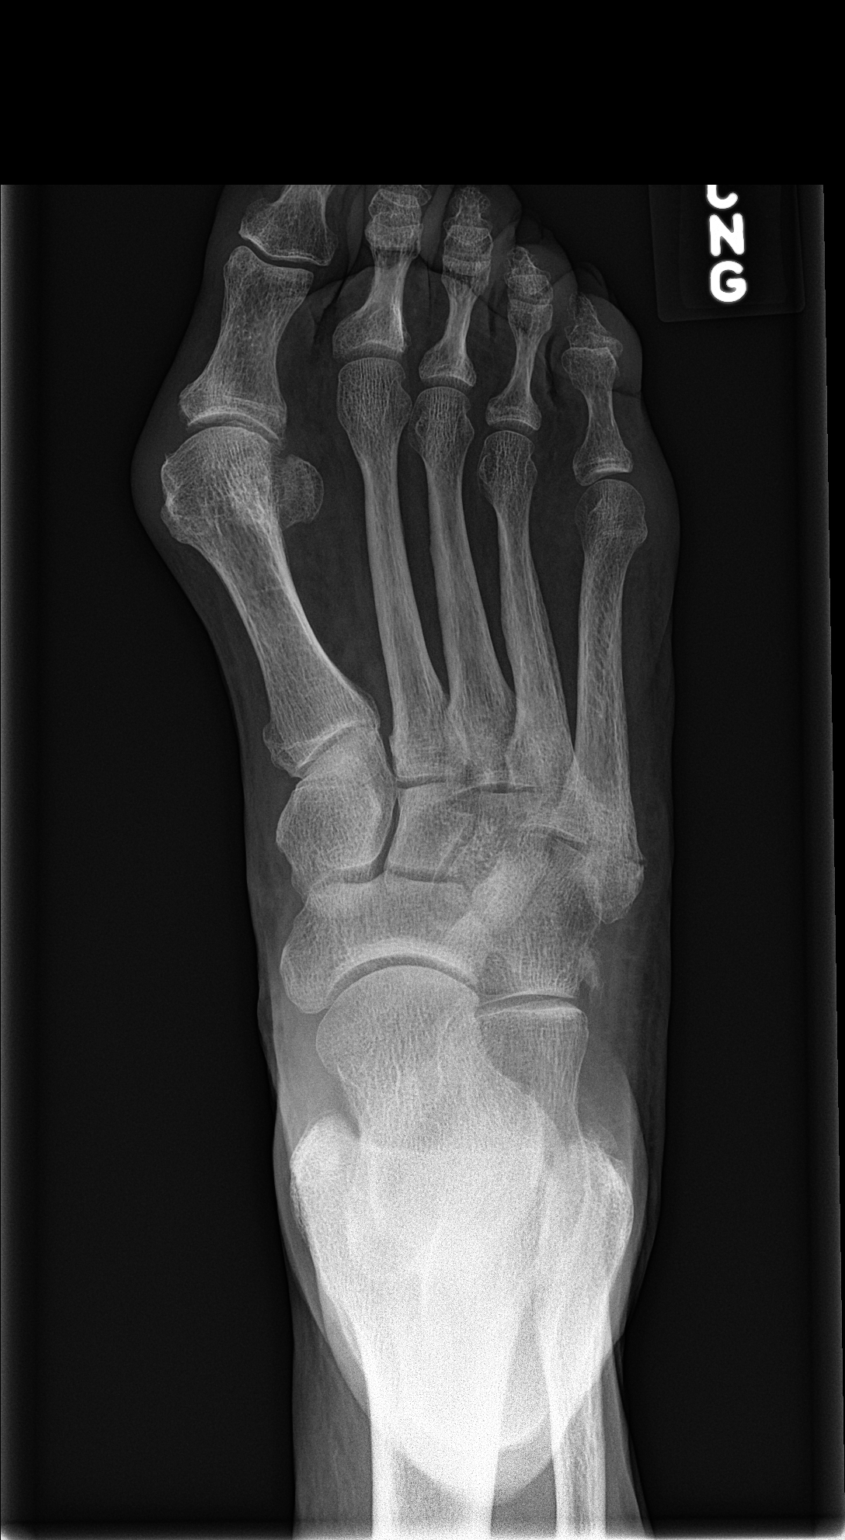

[foot lat]
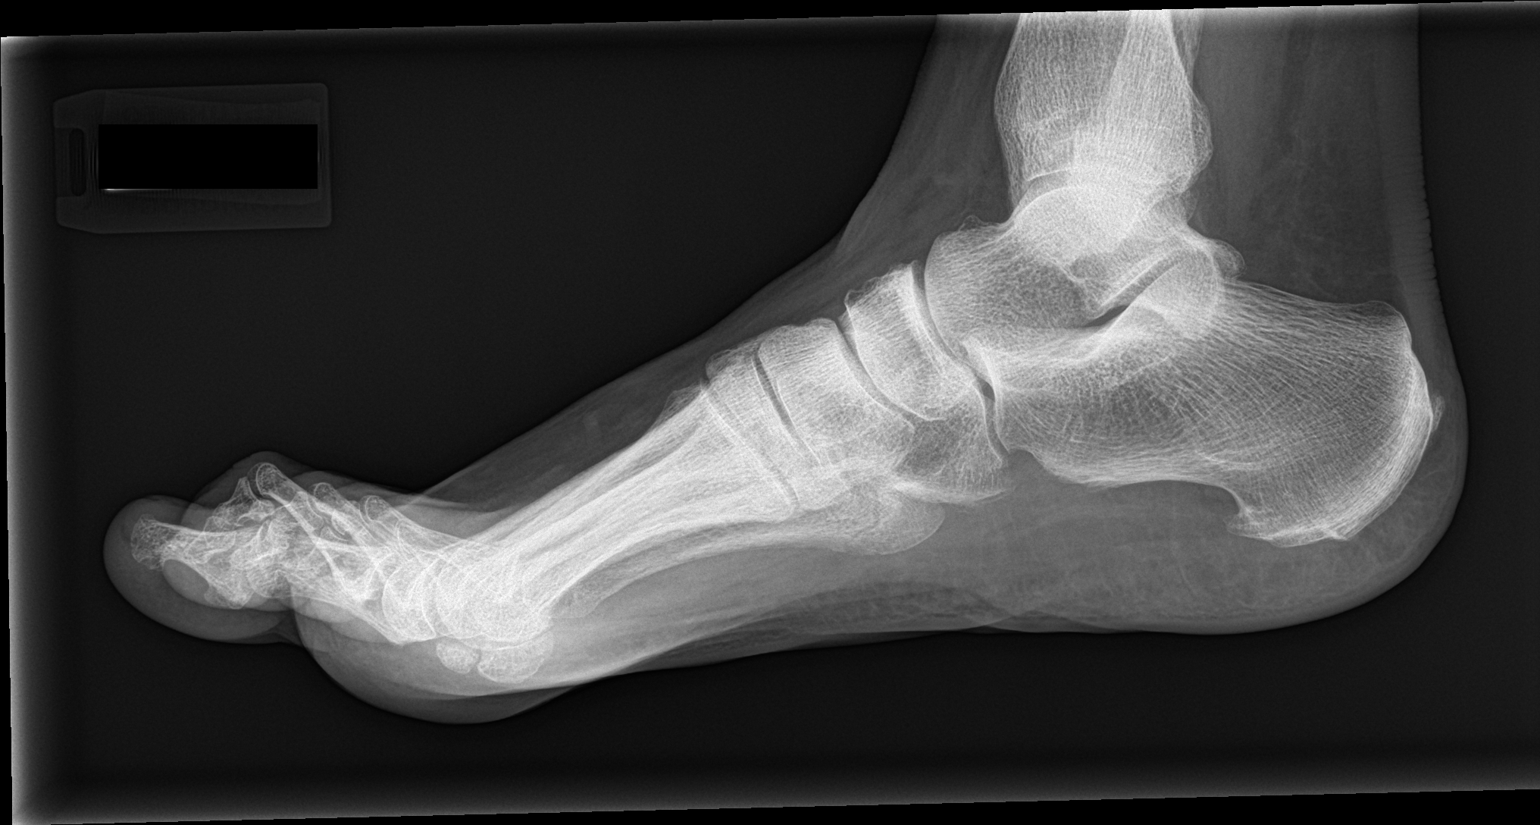

[2 of 2 positions shown; findings below may reference images not displayed]

FINDINGS: Nondisplaced fracture is seen across the base of the fifth
metatarsal with adjacent sclerosis. Subtle callus formation seen
along the lateral margin of the fracture line.

No new fractures.  No bone lesions.

There is a hallux valgus deformity with mild degenerative changes at
the first metatarsophalangeal joint. Small dorsal and plantar
calcaneal spurs are noted.

Soft tissues are unremarkable.
IMPRESSION: 1. Subacute fracture at the base of the fifth metatarsal,
nondisplaced. Subtle callus formation and adjacent sclerosis is
consistent with partial healing.

## 2019-06-18 ENCOUNTER — Ambulatory Visit: Payer: 59 | Attending: Internal Medicine

## 2019-06-18 DIAGNOSIS — Z23 Encounter for immunization: Secondary | ICD-10-CM

## 2019-06-18 NOTE — Progress Notes (Signed)
   Covid-19 Vaccination Clinic  Name:  Raven Ayers    MRN: [id number] DOB: [birth year]  06/18/2019  [name] was observed post Covid-19 immunization for 15 minutes without incident. She was provided with Vaccine Information Sheet and instruction to access the V-Safe system.   [name] was instructed to call 911 with any severe reactions post vaccine: Marland Kitchen Difficulty breathing  . Swelling of face and throat  . A fast heartbeat  . A bad rash all over body  . Dizziness and weakness   Immunizations Administered    Name Date Dose VIS Date Route   Moderna COVID-19 Vaccine 06/18/2019 10:36 AM 0.5 mL 03/09/2019 Intramuscular   Manufacturer: Moderna   Lot: [id number]   NDC: 04799-872-15

## 2019-07-05 ENCOUNTER — Other Ambulatory Visit: Payer: Self-pay | Admitting: Physician Assistant

## 2019-07-05 DIAGNOSIS — Z1231 Encounter for screening mammogram for malignant neoplasm of breast: Secondary | ICD-10-CM

## 2019-07-20 ENCOUNTER — Ambulatory Visit: Payer: 59 | Attending: Internal Medicine

## 2019-07-20 DIAGNOSIS — Z23 Encounter for immunization: Secondary | ICD-10-CM

## 2019-07-20 NOTE — Progress Notes (Signed)
   Covid-19 Vaccination Clinic  Name:  Raven Ayers    MRN: [id number] DOB: 01/18/1961  07/20/2019  Raven Ayers was observed post Covid-19 immunization for 15 minutes without incident. She was provided with Vaccine Information Sheet and instruction to access the V-Safe system.   Raven Ayers was instructed to call 911 with any severe reactions post vaccine: Marland Kitchen Difficulty breathing  . Swelling of face and throat  . A fast heartbeat  . A bad rash all over body  . Dizziness and weakness   Immunizations Administered    Name Date Dose VIS Date Route   Moderna COVID-19 Vaccine 07/20/2019  9:39 AM 0.5 mL 03/09/2019 Intramuscular   Manufacturer: Moderna   Lot: [id number]   NDC: 60630-160-10

## 2019-09-30 ENCOUNTER — Telehealth: Payer: Self-pay

## 2019-09-30 NOTE — Telephone Encounter (Signed)
Confirmed appointment on 10/04/2019 and screened for covid. klh °

## 2019-09-30 NOTE — Telephone Encounter (Signed)
Called lmom informing patient of appointment on 10/04/2019. klh °

## 2019-10-04 ENCOUNTER — Ambulatory Visit: Payer: 59 | Admitting: Internal Medicine

## 2019-10-04 ENCOUNTER — Encounter: Payer: Self-pay | Admitting: Internal Medicine

## 2019-10-04 ENCOUNTER — Other Ambulatory Visit: Payer: Self-pay

## 2019-10-04 VITALS — BP 128/77 | HR 77 | Temp 97.8°F | Resp 16 | Ht 67.0 in | Wt 175.8 lb

## 2019-10-04 DIAGNOSIS — R0602 Shortness of breath: Secondary | ICD-10-CM | POA: Diagnosis not present

## 2019-10-04 DIAGNOSIS — J449 Chronic obstructive pulmonary disease, unspecified: Secondary | ICD-10-CM

## 2019-10-04 DIAGNOSIS — I10 Essential (primary) hypertension: Secondary | ICD-10-CM

## 2019-10-04 MED ORDER — TRELEGY ELLIPTA 100-62.5-25 MCG/INH IN AEPB: 1.0000 | INHALATION_SPRAY | Freq: Every day | RESPIRATORY_TRACT | 6 refills | Status: DC

## 2019-10-04 MED ORDER — ALBUTEROL SULFATE HFA 108 (90 BASE) MCG/ACT IN AERS: 2.0000 | INHALATION_SPRAY | Freq: Four times a day (QID) | RESPIRATORY_TRACT | 3 refills | Status: DC | PRN

## 2019-10-04 NOTE — Progress Notes (Signed)
[facility] [address] [place], [place] [address]  Pulmonary Sleep Medicine   Office Visit Note  Patient Name: Raven Ayers DOB: Dec 21, 1960 MRN [id number]  Date of Service: 10/04/2019  Complaints/HPI: Pt is here for pulmonary follow. She has a history of copd.  She has been doing very well.  She continues to use trelegy and ventolin as prescribed. She reports using her rescue inhaler infrequently.   She has not had a PFT in some time. She did not do well with the plastic booth.  She will consider doing a pft on our new machine at next visit.   ROS  General: (-) fever, (-) chills, (-) night sweats, (-) weakness Skin: (-) rashes, (-) itching,. Eyes: (-) visual changes, (-) redness, (-) itching. Nose and Sinuses: (-) nasal stuffiness or itchiness, (-) postnasal drip, (-) nosebleeds, (-) sinus trouble. Mouth and Throat: (-) sore throat, (-) hoarseness. Neck: (-) swollen glands, (-) enlarged thyroid, (-) n - wheezing. Cardiovascular: - ankle swelling, (-) chest pain. Lymphatic: (-) lymph node enlargement. Neurologic: (-) numbness, (-) tingling. Psychiatric: (-) anxiety, (-) depression   Current Medication: Outpatient Encounter Medications as of 10/04/2019  Medication Sig  . albuterol (VENTOLIN HFA) 108 (90 Base) MCG/ACT inhaler Inhale 2 puffs into the lungs every 6 (six) hours as needed for wheezing or shortness of breath.  . Fluticasone-Umeclidin-Vilant (TRELEGY ELLIPTA) 100-62.5-25 MCG/INH AEPB Inhale 1 puff into the lungs daily.   No facility-administered encounter medications on file as of 10/04/2019.    Surgical History: Past Surgical History:  Procedure Laterality Date  . FOOT SURGERY Left     Medical History: Past Medical History:  Diagnosis Date  . Asthma     Family History: Family History  Problem Relation Age of Onset  . Cancer Mother   . Alzheimer's disease Father     Social History: Social History   Socioeconomic History  . Marital  status: Widowed    Spouse name: Not on file  . Number of children: Not on file  . Years of education: Not on file  . Highest education level: Not on file  Occupational History  . Not on file  Tobacco Use  . Smoking status: Former Smoker    Types: Cigarettes  . Smokeless tobacco: Never Used  . Tobacco comment: 10 years pt quit  Vaping Use  . Vaping Use: Never used  Substance and Sexual Activity  . Alcohol use: Yes    Comment: social  . Drug use: Never  . Sexual activity: Not on file  Other Topics Concern  . Not on file  Social History Narrative  . Not on file   Social Determinants of Health   Financial Resource Strain:   . Difficulty of Paying Living Expenses:   Food Insecurity:   . Worried About Programme researcher, broadcasting/film/video in the Last Year:   . Barista in the Last Year:   Transportation Needs:   . Freight forwarder (Medical):   Marland Kitchen Lack of Transportation (Non-Medical):   Physical Activity:   . Days of Exercise per Week:   . Minutes of Exercise per Session:   Stress:   . Feeling of Stress :   Social Connections:   . Frequency of Communication with Friends and Family:   . Frequency of Social Gatherings with Friends and Family:   . Attends Religious Services:   . Active Member of Clubs or Organizations:   . Attends Banker Meetings:   Marland Kitchen Marital  Status:   Intimate Partner Violence:   . Fear of Current or Ex-Partner:   . Emotionally Abused:   Marland Kitchen Physically Abused:   . Sexually Abused:     Vital Signs: Blood pressure (!) 148/76, pulse 77, temperature 97.8 F (36.6 C), resp. rate 16, height 5\' 7"  (1.702 m), weight 175 lb 12.8 oz (79.7 kg), SpO2 96 %, unknown if currently breastfeeding.  Examination: General Appearance: The patient is well-developed, well-nourished, and in no distress. Skin: Gross inspection of skin unremarkable. Head: normocephalic, no gross deformities. Eyes: no gross deformities noted. ENT: ears appear grossly normal no  exudates. Neck: Supple. No thyromegaly. No LAD. Respiratory: clear bilaterally. Cardiovascular: Normal S1 and S2 without murmur or rub. Extremities: No cyanosis. pulses are equal. Neurologic: Alert and oriented. No involuntary movements.  LABS: No results found for this or any previous visit (from the past 2160 hour(s)).  Radiology: DG Foot Complete Right  Result Date: 05/06/2017 CLINICAL DATA:  Follow-up foot fracture EXAM: RIGHT FOOT COMPLETE - 3+ VIEW COMPARISON:  None. FINDINGS: Healing fracture at the base of the 5th metatarsal. Fracture lucency remains minimally visible. Hallux valgus deformity with degenerative changes of the 1st MTP joint. Visualized soft tissues are within normal limits. IMPRESSION: Healing fracture at the base of the 5th metatarsal. Fracture lucency remains minimally visible. Electronically Signed   By: Julian Hy M.D.   On: 05/06/2017 17:09    No results found.  No results found.    Assessment and Plan: There are no problems to display for this patient.   1. Chronic obstructive pulmonary disease, unspecified COPD type ([place]) Continue to use trelegy and ventolin as directed.  - Fluticasone-Umeclidin-Vilant (TRELEGY ELLIPTA) 100-62.5-25 MCG/INH AEPB; Inhale 1 puff into the lungs daily.  Dispense: 60 each; Refill: 6 - albuterol (VENTOLIN HFA) 108 (90 Base) MCG/ACT inhaler; Inhale 2 puffs into the lungs every 6 (six) hours as needed for wheezing or shortness of breath.  Dispense: 54 g; Refill: 3  2. Essential hypertension Stable, continue to follow with pcp.  3. SOB (shortness of breath) - Spirometry with Graph - Fluticasone-Umeclidin-Vilant (TRELEGY ELLIPTA) 100-62.5-25 MCG/INH AEPB; Inhale 1 puff into the lungs daily.  Dispense: 60 each; Refill: 6 - albuterol (VENTOLIN HFA) 108 (90 Base) MCG/ACT inhaler; Inhale 2 puffs into the lungs every 6 (six) hours as needed for wheezing or shortness of breath.  Dispense: 54 g; Refill: 3  General Counseling:  I have discussed the findings of the evaluation and examination with Phoenyx.  I have also discussed any further diagnostic evaluation thatmay be needed or ordered today. Mackensie verbalizes understanding of the findings of todays visit. We also reviewed her medications today and discussed drug interactions and side effects including but not limited excessive drowsiness and altered mental states. We also discussed that there is always a risk not just to her but also people around her. she has been encouraged to call the office with any questions or concerns that should arise related to todays visit.  Orders Placed This Encounter  Procedures  . Spirometry with Graph    Order Specific Question:   Where should this test be performed?    Answer:   [facility]     Time spent: 25 This patient was seen by Orson Gear AGNP-C in Collaboration with Dr. Devona Konig as a part of collaborative care agreement.   I have personally obtained a history, examined the patient, evaluated laboratory and imaging results, formulated the assessment and plan and placed orders.  Allyne Gee, MD [facility] Pulmonary and Critical Care Sleep medicine

## 2020-04-03 ENCOUNTER — Other Ambulatory Visit: Payer: Self-pay

## 2020-04-03 ENCOUNTER — Ambulatory Visit: Payer: 59 | Admitting: Internal Medicine

## 2020-04-03 ENCOUNTER — Encounter: Payer: Self-pay | Admitting: Internal Medicine

## 2020-04-03 VITALS — BP 144/74 | HR 80 | Temp 97.2°F | Resp 16 | Ht 66.0 in | Wt 173.0 lb

## 2020-04-03 DIAGNOSIS — R0602 Shortness of breath: Secondary | ICD-10-CM

## 2020-04-03 DIAGNOSIS — J449 Chronic obstructive pulmonary disease, unspecified: Secondary | ICD-10-CM | POA: Diagnosis not present

## 2020-04-03 DIAGNOSIS — I10 Essential (primary) hypertension: Secondary | ICD-10-CM

## 2020-04-03 NOTE — Progress Notes (Signed)
[facility] [address] [place], [place] [address]  Pulmonary Sleep Medicine   Office Visit Note  Patient Name: Raven Ayers DOB: [birth year] MRN [id number]  Date of Service: 04/08/2020  Complaints/HPI: Patient is here for routine pulmonary follow-up Followed for COPD, reports breathing has been stable, no recent exacerbations or hospitalizations Currently using Trelegy inhaler daily--rarely has to use rescue albuterol inhaler She has had bad experience with PFT testing in the past and at this time continues to refuse repeat testing  ROS  General: (-) fever, (-) chills, (-) night sweats, (-) weakness Skin: (-) rashes, (-) itching,. Eyes: (-) visual changes, (-) redness, (-) itching. Nose and Sinuses: (-) nasal stuffiness or itchiness, (-) postnasal drip, (-) nosebleeds, (-) sinus trouble. Mouth and Throat: (-) sore throat, (-) hoarseness. Neck: (-) swollen glands, (-) enlarged thyroid, (-) neck pain. Respiratory: - cough, (-) bloody sputum, - shortness of breath, - wheezing. Cardiovascular: - ankle swelling, (-) chest pain. Lymphatic: (-) lymph node enlargement. Neurologic: (-) numbness, (-) tingling. Psychiatric: (-) anxiety, (-) depression   Current Medication: Outpatient Encounter Medications as of 04/03/2020  Medication Sig  . albuterol (VENTOLIN HFA) 108 (90 Base) MCG/ACT inhaler Inhale 2 puffs into the lungs every 6 (six) hours as needed for wheezing or shortness of breath.  . Fluticasone-Umeclidin-Vilant (TRELEGY ELLIPTA) 100-62.5-25 MCG/INH AEPB Inhale 1 puff into the lungs daily.   No facility-administered encounter medications on file as of 04/03/2020.    Surgical History: Past Surgical History:  Procedure Laterality Date  . FOOT SURGERY Left     Medical History: Past Medical History:  Diagnosis Date  . Asthma     Family History: Family History  Problem Relation Age of Onset  . Cancer Mother   . Alzheimer's disease Father      Social History: Social History   Socioeconomic History  . Marital status: Widowed    Spouse name: Not on file  . Number of children: Not on file  . Years of education: Not on file  . Highest education level: Not on file  Occupational History  . Not on file  Tobacco Use  . Smoking status: Former Smoker    Types: Cigarettes  . Smokeless tobacco: Never Used  . Tobacco comment: 10 years pt quit  Vaping Use  . Vaping Use: Never used  Substance and Sexual Activity  . Alcohol use: Yes    Comment: social  . Drug use: Never  . Sexual activity: Not on file  Other Topics Concern  . Not on file  Social History Narrative  . Not on file   Social Determinants of Health   Financial Resource Strain: Not on file  Food Insecurity: Not on file  Transportation Needs: Not on file  Physical Activity: Not on file  Stress: Not on file  Social Connections: Not on file  Intimate Partner Violence: Not on file    Vital Signs: Blood pressure (!) 144/74, pulse 80, temperature (!) 97.2 F (36.2 C), resp. rate 16, height 5\' 6"  (1.676 m), weight 173 lb (78.5 kg), SpO2 98 %, unknown if currently breastfeeding.  Examination: General Appearance: The patient is well-developed, well-nourished, and in no distress. Skin: Gross inspection of skin unremarkable. Head: normocephalic, no gross deformities. Eyes: no gross deformities noted. ENT: ears appear grossly normal no exudates. Neck: Supple. No thyromegaly. No LAD. Respiratory: Clear throughout, no rhonchi, wheeze or rales noted. Cardiovascular: Normal S1 and S2 without murmur or rub. Extremities: No cyanosis. pulses are equal. Neurologic: Alert and  oriented. No involuntary movements.  LABS: No results found for this or any previous visit (from the past 2160 hour(s)).  Radiology: DG Foot Complete Right  Result Date: 05/06/2017 CLINICAL DATA:  Follow-up foot fracture EXAM: RIGHT FOOT COMPLETE - 3+ VIEW COMPARISON:  None. FINDINGS: Healing  fracture at the base of the 5th metatarsal. Fracture lucency remains minimally visible. Hallux valgus deformity with degenerative changes of the 1st MTP joint. Visualized soft tissues are within normal limits. IMPRESSION: Healing fracture at the base of the 5th metatarsal. Fracture lucency remains minimally visible. Electronically Signed   By: Charline Bills M.D.   On: 05/06/2017 17:09    No results found.  No results found.    Assessment and Plan: Patient Active Problem List   Diagnosis Date Noted  . Chronic obstructive pulmonary disease (HCC) 04/08/2020  . Essential hypertension 04/08/2020   1. Chronic obstructive pulmonary disease, unspecified COPD type (HCC) Symptoms remain well controlled on Trelegy, continue with this  2. Essential hypertension Advised to monitor BP as it is elevated today and follow-up with PCP  3. SOB (shortness of breath) Spirometry stable today--FEV 1.8L, 65% - Spirometry with Graph  General Counseling: I have discussed the findings of the evaluation and examination with Raven Ayers.  I have also discussed any further diagnostic evaluation thatmay be needed or ordered today. Raven Ayers verbalizes understanding of the findings of todays visit. We also reviewed her medications today and discussed drug interactions and side effects including but not limited excessive drowsiness and altered mental states. We also discussed that there is always a risk not just to her but also people around her. she has been encouraged to call the office with any questions or concerns that should arise related to todays visit.  Orders Placed This Encounter  Procedures  . Spirometry with Graph    Order Specific Question:   Where should this test be performed?    Answer:   [facility]    Order Specific Question:   Basic spirometry    Answer:   Yes     Time spent: 45  I have personally obtained a history, examined the patient, evaluated laboratory and imaging results,  formulated the assessment and plan and placed orders. This patient was seen by Brent General AGNP-C in Collaboration with Dr. Freda Munro as a part of collaborative care agreement.    Yevonne Pax, MD [facility] Pulmonary and Critical Care Sleep medicine

## 2020-04-04 ENCOUNTER — Encounter: Payer: Self-pay | Admitting: Internal Medicine

## 2020-04-04 ENCOUNTER — Ambulatory Visit: Payer: 59 | Admitting: Internal Medicine

## 2020-04-08 ENCOUNTER — Encounter: Payer: Self-pay | Admitting: Internal Medicine

## 2020-04-08 DIAGNOSIS — J449 Chronic obstructive pulmonary disease, unspecified: Secondary | ICD-10-CM | POA: Insufficient documentation

## 2020-04-08 DIAGNOSIS — I10 Essential (primary) hypertension: Secondary | ICD-10-CM | POA: Insufficient documentation

## 2020-04-08 NOTE — Patient Instructions (Signed)
Chronic Obstructive Pulmonary Disease Chronic obstructive pulmonary disease (COPD) is a long-term (chronic) lung problem. When you have COPD, it is hard for air to get in and out of your lungs. Usually the condition gets worse over time, and your lungs will never return to normal. There are things you can do to keep yourself as healthy as possible.  Your doctor may treat your condition with: ? Medicines. ? Oxygen. ? Lung surgery.  Your doctor may also recommend: ? Rehabilitation. This includes steps to make your body work better. It may involve a team of specialists. ? Quitting smoking, if you smoke. ? Exercise and changes to your diet. ? Comfort measures (palliative care). Follow these instructions at home: Medicines  Take over-the-counter and prescription medicines only as told by your doctor.  Talk to your doctor before taking any cough or allergy medicines. You may need to avoid medicines that cause your lungs to be dry. Lifestyle  If you smoke, stop. Smoking makes the problem worse. If you need help quitting, ask your doctor.  Avoid being around things that make your breathing worse. This may include smoke, chemicals, and fumes.  Stay active, but remember to rest as well.  Learn and use tips on how to relax.  Make sure you get enough sleep. Most adults need at least 7 hours of sleep every night.  Eat healthy foods. Eat smaller meals more often. Rest before meals. Controlled breathing Learn and use tips on how to control your breathing as told by your doctor. Try:  Breathing in (inhaling) through your nose for 1 second. Then, pucker your lips and breath out (exhale) through your lips for 2 seconds.  Putting one hand on your belly (abdomen). Breathe in slowly through your nose for 1 second. Your hand on your belly should move out. Pucker your lips and breathe out slowly through your lips. Your hand on your belly should move in as you breathe out.  Controlled coughing Learn  and use controlled coughing to clear mucus from your lungs. Follow these steps: 1. Lean your head a little forward. 2. Breathe in deeply. 3. Try to hold your breath for 3 seconds. 4. Keep your mouth slightly open while coughing 2 times. 5. Spit any mucus out into a tissue. 6. Rest and do the steps again 1 or 2 times as needed. General instructions  Make sure you get all the shots (vaccines) that your doctor recommends. Ask your doctor about a flu shot and a pneumonia shot.  Use oxygen therapy and pulmonary rehabilitation if told by your doctor. If you need home oxygen therapy, ask your doctor if you should buy a tool to measure your oxygen level (oximeter).  Make a COPD action plan with your doctor. This helps you to know what to do if you feel worse than usual.  Manage any other conditions you have as told by your doctor.  Avoid going outside when it is very hot, cold, or humid.  Avoid people who have a sickness you can catch (contagious).  Keep all follow-up visits as told by your doctor. This is important. Contact a doctor if:  You cough up more mucus than usual.  There is a change in the color or thickness of the mucus.  It is harder to breathe than usual.  Your breathing is faster than usual.  You have trouble sleeping.  You need to use your medicines more often than usual.  You have trouble doing your normal activities such as getting dressed   or walking around the house. Get help right away if:  You have shortness of breath while resting.  You have shortness of breath that stops you from: ? Being able to talk. ? Doing normal activities.  Your chest hurts for longer than 5 minutes.  Your skin color is more blue than usual.  Your pulse oximeter shows that you have low oxygen for longer than 5 minutes.  You have a fever.  You feel too tired to breathe normally. Summary  Chronic obstructive pulmonary disease (COPD) is a long-term lung problem.  The way your  lungs work will never return to normal. Usually the condition gets worse over time. There are things you can do to keep yourself as healthy as possible.  Take over-the-counter and prescription medicines only as told by your doctor.  If you smoke, stop. Smoking makes the problem worse. This information is not intended to replace advice given to you by your health care provider. Make sure you discuss any questions you have with your health care provider. Document Revised: 03/07/2017 Document Reviewed: 04/29/2016 Elsevier Patient Education  2020 Elsevier Inc.  

## 2020-07-25 ENCOUNTER — Other Ambulatory Visit: Payer: Self-pay | Admitting: Internal Medicine

## 2020-07-25 DIAGNOSIS — R0602 Shortness of breath: Secondary | ICD-10-CM

## 2020-07-25 DIAGNOSIS — J449 Chronic obstructive pulmonary disease, unspecified: Secondary | ICD-10-CM

## 2020-10-02 ENCOUNTER — Ambulatory Visit: Payer: 59 | Admitting: Nurse Practitioner

## 2020-10-03 ENCOUNTER — Other Ambulatory Visit: Payer: Self-pay

## 2020-10-03 ENCOUNTER — Ambulatory Visit: Payer: 59 | Admitting: Nurse Practitioner

## 2020-10-03 ENCOUNTER — Encounter: Payer: Self-pay | Admitting: Nurse Practitioner

## 2020-10-03 VITALS — BP 150/79 | HR 65 | Temp 98.7°F | Resp 16 | Ht 66.0 in | Wt 169.6 lb

## 2020-10-03 DIAGNOSIS — I10 Essential (primary) hypertension: Secondary | ICD-10-CM

## 2020-10-03 DIAGNOSIS — R0602 Shortness of breath: Secondary | ICD-10-CM | POA: Diagnosis not present

## 2020-10-03 DIAGNOSIS — J449 Chronic obstructive pulmonary disease, unspecified: Secondary | ICD-10-CM | POA: Diagnosis not present

## 2020-10-03 MED ORDER — VENTOLIN HFA 108 (90 BASE) MCG/ACT IN AERS: 2.0000 | INHALATION_SPRAY | Freq: Four times a day (QID) | RESPIRATORY_TRACT | 5 refills | Status: DC | PRN

## 2020-10-03 MED ORDER — TRELEGY ELLIPTA 100-62.5-25 MCG/INH IN AEPB: 1.0000 | INHALATION_SPRAY | Freq: Every day | RESPIRATORY_TRACT | 6 refills | Status: DC

## 2020-10-03 NOTE — Progress Notes (Signed)
[facility] [address] [place], [place] [address]  Internal MEDICINE  Office Visit Note  Patient Name: Raven Ayers  [id number]  [id number]  Date of Service: 10/03/2020  Chief Complaint  Patient presents with   Asthma   Follow-up    Med review     HPI Alle presents for a follow up visit for asthma medication review. She has been using trelegy ellipta and she has a rescue inhaler. She denies any wheezing, SOB, or cough. She reports her breathing is improved. Spirometry done in office today. She has an 8.84% improvement in her FEV1/FVC ratio.     Current Medication: Outpatient Encounter Medications as of 10/03/2020  Medication Sig   VENTOLIN HFA 108 (90 Base) MCG/ACT inhaler Inhale 2 puffs into the lungs every 6 (six) hours as needed for wheezing or shortness of breath.   [DISCONTINUED] albuterol (VENTOLIN HFA) 108 (90 Base) MCG/ACT inhaler Inhale 2 puffs into the lungs every 6 (six) hours as needed for wheezing or shortness of breath.   [DISCONTINUED] TRELEGY ELLIPTA 100-62.5-25 MCG/INH AEPB INHALE 1 PUFF INTO THE LUNGS DAILY   Fluticasone-Umeclidin-Vilant (TRELEGY ELLIPTA) 100-62.5-25 MCG/INH AEPB Inhale 1 puff into the lungs daily.   No facility-administered encounter medications on file as of 10/03/2020.    Surgical History: Past Surgical History:  Procedure Laterality Date   FOOT SURGERY Left     Medical History: Past Medical History:  Diagnosis Date   Asthma     Family History: Family History  Problem Relation Age of Onset   Cancer Mother    Alzheimer's disease Father     Social History   Socioeconomic History   Marital status: Widowed    Spouse name: Not on file   Number of children: Not on file   Years of education: Not on file   Highest education level: Not on file  Occupational History   Not on file  Tobacco Use   Smoking status: Former    Pack years: 0.00    Types: Cigarettes   Smokeless tobacco: Never   Tobacco comments:     10 years pt quit  Vaping Use   Vaping Use: Never used  Substance and Sexual Activity   Alcohol use: Yes    Comment: social   Drug use: Never   Sexual activity: Not on file  Other Topics Concern   Not on file  Social History Narrative   Not on file   Social Determinants of Health   Financial Resource Strain: Not on file  Food Insecurity: Not on file  Transportation Needs: Not on file  Physical Activity: Not on file  Stress: Not on file  Social Connections: Not on file  Intimate Partner Violence: Not on file      Review of Systems  Constitutional:  Negative for fatigue.  Respiratory: Negative.  Negative for cough, chest tightness, shortness of breath and wheezing.   Cardiovascular: Negative.  Negative for chest pain.   Vital Signs: BP (!) 150/79   Pulse 65   Temp 98.7 F (37.1 C)   Resp 16   Ht 5\' 6"  (1.676 m)   Wt 169 lb 9.6 oz (76.9 kg)   SpO2 97%   BMI 27.37 kg/m    Physical Exam Vitals reviewed.  Constitutional:      General: She is not in acute distress.    Appearance: Normal appearance. She is not ill-appearing.  HENT:     Head: Normocephalic and atraumatic.  Cardiovascular:     Rate  and Rhythm: Normal rate and regular rhythm.     Pulses: Normal pulses.     Heart sounds: Normal heart sounds. No murmur heard. Pulmonary:     Effort: Pulmonary effort is normal. No respiratory distress.     Breath sounds: Normal breath sounds. No wheezing.  Skin:    General: Skin is warm and dry.     Capillary Refill: Capillary refill takes less than 2 seconds.     Coloration: Skin is not cyanotic.  Neurological:     Mental Status: She is alert and oriented to person, place, and time.    Assessment/Plan: 1. Chronic obstructive pulmonary disease, unspecified COPD type (HCC) Symptoms continue to remain well controlled with Trelegy, continue as prescribed, refills sent to pharmacy.  - Fluticasone-Umeclidin-Vilant (TRELEGY ELLIPTA) 100-62.5-25 MCG/INH AEPB; Inhale 1  puff into the lungs daily.  Dispense: 60 each; Refill: 6  2. SOB (shortness of breath) Spirometry stable and improving today, FEV1/FVC ratio improved by 8.84% since her last spirometry from December 2021. Ventolin inhaler refills sent to pharmacy.  - Spirometry with graph; Future - Spirometry with graph - VENTOLIN HFA 108 (90 Base) MCG/ACT inhaler; Inhale 2 puffs into the lungs every 6 (six) hours as needed for wheezing or shortness of breath.  Dispense: 8 g; Refill: 5  3. Essential hypertension Blood pressure remains elevated, reminded patient to monitor blood pressure and follow up with PCP.    General Counseling: keyetta hollingworth understanding of the findings of todays visit and agrees with plan of treatment. I have discussed any further diagnostic evaluation that may be needed or ordered today. We also reviewed her medications today. she has been encouraged to call the office with any questions or concerns that should arise related to todays visit.    Orders Placed This Encounter  Procedures   Spirometry with graph    Meds ordered this encounter  Medications   Fluticasone-Umeclidin-Vilant (TRELEGY ELLIPTA) 100-62.5-25 MCG/INH AEPB    Sig: Inhale 1 puff into the lungs daily.    Dispense:  60 each    Refill:  6   VENTOLIN HFA 108 (90 Base) MCG/ACT inhaler    Sig: Inhale 2 puffs into the lungs every 6 (six) hours as needed for wheezing or shortness of breath.    Dispense:  8 g    Refill:  5    Return in about 6 months (around 04/04/2021) for F/U, pulmonary only, Gradie Butrick PCP.   Total time spent:30 Minutes Time spent includes review of chart, medications, test results, and follow up plan with the patient.   [place] Controlled Substance Database was reviewed by me.  This patient was seen by Sallyanne Kuster, FNP-C in collaboration with Dr. Beverely Risen as a part of collaborative care agreement.   Linnaea Ahn R. Tedd Sias, MSN, FNP-C Internal medicine

## 2020-10-11 ENCOUNTER — Telehealth: Payer: Self-pay

## 2020-10-11 NOTE — Telephone Encounter (Signed)
Completed medical records for [facility]. Faxed records  and payment request to [phone] for $6.75 (P) [phone]

## 2020-10-31 ENCOUNTER — Telehealth: Payer: Self-pay

## 2020-10-31 NOTE — Telephone Encounter (Signed)
Had PA for ventolin inhaler, spoke to pharmacy and they stated if a PA is needed they will send [place] a fax for the PA.  Usually they change it to another brand of albuterol

## 2020-11-09 ENCOUNTER — Telehealth: Payer: Self-pay

## 2020-11-09 NOTE — Telephone Encounter (Signed)
Completed medical records for [facility] Faxed payment request of $ 14.25 and records to [phone] (P) [phone] Out reach id is [phone]

## 2020-11-24 ENCOUNTER — Other Ambulatory Visit: Payer: Self-pay | Admitting: Internal Medicine

## 2020-11-24 DIAGNOSIS — J449 Chronic obstructive pulmonary disease, unspecified: Secondary | ICD-10-CM

## 2020-11-28 ENCOUNTER — Other Ambulatory Visit: Payer: Self-pay

## 2020-11-28 ENCOUNTER — Encounter: Payer: Self-pay | Admitting: Nurse Practitioner

## 2020-11-28 ENCOUNTER — Telehealth: Payer: 59 | Admitting: Nurse Practitioner

## 2020-11-28 VITALS — Temp 98.6°F | Ht 67.0 in | Wt 168.0 lb

## 2020-11-28 DIAGNOSIS — J018 Other acute sinusitis: Secondary | ICD-10-CM | POA: Diagnosis not present

## 2020-11-28 DIAGNOSIS — U071 COVID-19: Secondary | ICD-10-CM | POA: Diagnosis not present

## 2020-11-28 MED ORDER — AZITHROMYCIN 250 MG PO TABS: ORAL_TABLET | ORAL | 0 refills | Status: DC

## 2020-11-28 MED ORDER — NIRMATRELVIR/RITONAVIR (PAXLOVID)TABLET: 3.0000 | ORAL_TABLET | Freq: Two times a day (BID) | ORAL | 0 refills | Status: AC

## 2020-11-28 MED ORDER — PREDNISONE 10 MG PO TABS
ORAL_TABLET | ORAL | 0 refills | Status: DC
Start: 2020-11-28 — End: 2022-12-03

## 2020-11-28 NOTE — Progress Notes (Signed)
[facility] [address] [place], [place] [address]  Internal MEDICINE  Telephone Visit  Patient Name: Raven Ayers  [id number]  [id number]  Date of Service: 11/28/2020  I connected with the patient at 3:35 PM by telephone and verified the patients identity using two identifiers.   I discussed the limitations, risks, security and privacy concerns of performing an evaluation and management service by telephone and the availability of in person appointments. I also discussed with the patient that there may be a patient responsible charge related to the service.  The patient expressed understanding and agrees to proceed.    Chief Complaint  Patient presents with   Telephone Assessment    [phone]   Telephone Screen    Covid positive yesterday    Sinusitis    Pt called her PCP they due to paxlovid interact with trelegy they want her to see [place]    Headache    HPI Raven Ayers presents for a telehealth virtual visit for COVID positive with sinusitis symptoms. She reports fever, fatigue, nasal congestion, postnasal drip, runny nose, sinus pain/pressure, headache, and sore throat. She tested positive for. She was instructed to call her pulmonologist by her PCP due to the drug interaction between paxlovid and her trelegy ellipta inhaler.    Current Medication: Outpatient Encounter Medications as of 11/28/2020  Medication Sig   azithromycin (ZITHROMAX) 250 MG tablet Take one tab a day for 10 days for uri   [EXPIRED] nirmatrelvir/ritonavir EUA (PAXLOVID) 20 x 150 MG & 10 x 100MG  TABS Take 3 tablets by mouth 2 (two) times daily for 5 days.   predniSONE (DELTASONE) 10 MG tablet Take one tab 3 x day for 3 days, then take one tab 2 x a day for 3 days and then take one tab a day for 3 days for copd   TRELEGY ELLIPTA 100-62.5-25 MCG/INH AEPB INHALE 1 PUFF INTO THE LUNGS DAILY   VENTOLIN HFA 108 (90 Base) MCG/ACT inhaler Inhale 2 puffs into the lungs every 6 (six) hours as needed for  wheezing or shortness of breath.   No facility-administered encounter medications on file as of 11/28/2020.    Surgical History: Past Surgical History:  Procedure Laterality Date   FOOT SURGERY Left     Medical History: Past Medical History:  Diagnosis Date   Asthma     Family History: Family History  Problem Relation Age of Onset   Cancer Mother    Alzheimer's disease Father     Social History   Socioeconomic History   Marital status: Widowed    Spouse name: Not on file   Number of children: Not on file   Years of education: Not on file   Highest education level: Not on file  Occupational History   Not on file  Tobacco Use   Smoking status: Former    Types: Cigarettes   Smokeless tobacco: Never   Tobacco comments:    10 years pt quit  Vaping Use   Vaping Use: Never used  Substance and Sexual Activity   Alcohol use: Yes    Comment: social   Drug use: Never   Sexual activity: Not on file  Other Topics Concern   Not on file  Social History Narrative   Not on file   Social Determinants of Health   Financial Resource Strain: Not on file  Food Insecurity: Not on file  Transportation Needs: Not on file  Physical Activity: Not on file  Stress: Not on file  Social Connections: Not on file  Intimate Partner Violence: Not on file      Review of Systems  Constitutional:  Positive for fatigue and fever. Negative for chills and unexpected weight change.  HENT:  Positive for congestion, postnasal drip, rhinorrhea, sinus pressure, sinus pain and sore throat. Negative for sneezing.   Eyes:  Negative for redness.  Respiratory:  Negative for cough, chest tightness and shortness of breath.   Cardiovascular:  Negative for chest pain and palpitations.  Gastrointestinal:  Negative for abdominal pain, constipation, diarrhea, nausea and vomiting.  Genitourinary:  Negative for dysuria and frequency.  Musculoskeletal:  Negative for arthralgias, back pain, joint swelling  and neck pain.  Skin:  Negative for rash.  Neurological:  Positive for headaches. Negative for tremors and numbness.  Hematological:  Negative for adenopathy. Does not bruise/bleed easily.  Psychiatric/Behavioral:  Negative for behavioral problems (Depression), sleep disturbance and suicidal ideas. The patient is not nervous/anxious.    Vital Signs: Temp 98.6 F (37 C)   Ht 5\' 7"  (1.702 m)   Wt 168 lb (76.2 kg)   BMI 26.31 kg/m    Observation/Objective: She is alert and oriented, engages in conversation appropriately. She does not appear in any acute distress.    Assessment/Plan: 1. COVID-19 virus infection The interaction is only that paxlovid can increase serum concentration of fluticasone which is  - predniSONE (DELTASONE) 10 MG tablet; Take one tab 3 x day for 3 days, then take one tab 2 x a day for 3 days and then take one tab a day for 3 days for copd  Dispense: 18 tablet; Refill: 0 - nirmatrelvir/ritonavir EUA (PAXLOVID) 20 x 150 MG & 10 x 100MG  TABS; Take 3 tablets by mouth 2 (two) times daily for 5 days.  Dispense: 30 tablet; Refill: 0  2. Acute non-recurrent sinusitis of other sinus Empiric treatment prescribed and prevention for secondary bacterial infection due to COVID - azithromycin (ZITHROMAX) 250 MG tablet; Take one tab a day for 10 days for uri  Dispense: 10 tablet; Refill: 0   General Counseling: Raven Ayers understanding of the findings of today's phone visit and agrees with plan of treatment. I have discussed any further diagnostic evaluation that may be needed or ordered today. We also reviewed her medications today. she has been encouraged to call the office with any questions or concerns that should arise related to todays visit.  Return if symptoms worsen or fail to improve.   No orders of the defined types were placed in this encounter.   Meds ordered this encounter  Medications   predniSONE (DELTASONE) 10 MG tablet    Sig: Take one tab 3 x day  for 3 days, then take one tab 2 x a day for 3 days and then take one tab a day for 3 days for copd    Dispense:  18 tablet    Refill:  0   azithromycin (ZITHROMAX) 250 MG tablet    Sig: Take one tab a day for 10 days for uri    Dispense:  10 tablet    Refill:  0   nirmatrelvir/ritonavir EUA (PAXLOVID) 20 x 150 MG & 10 x 100MG  TABS    Sig: Take 3 tablets by mouth 2 (two) times daily for 5 days.    Dispense:  30 tablet    Refill:  0    Time spent:20 Minutes Time spent with patient included reviewing progress notes, labs, imaging studies, and discussing plan for follow up.  [place] Controlled Substance Database was reviewed by me for overdose risk score (ORS) if appropriate.  This patient was seen by Sallyanne Kuster, FNP-C in collaboration with Dr. Beverely Risen as a part of collaborative care agreement.  Arnetra Terris R. Tedd Sias, MSN, FNP-C Internal medicine

## 2021-03-12 ENCOUNTER — Telehealth: Payer: Self-pay

## 2021-03-12 NOTE — Telephone Encounter (Signed)
Completed medical records for [facility] Mailed to [facility] [address] [place] [address] Faxed payment request to [phone] for $11.25 (sent with 2 other charts for the total amount of payment to be $60.75) (P) [phone]

## 2021-04-05 ENCOUNTER — Ambulatory Visit: Payer: 59 | Admitting: Nurse Practitioner

## 2021-04-05 ENCOUNTER — Other Ambulatory Visit: Payer: Self-pay

## 2021-04-05 ENCOUNTER — Encounter: Payer: Self-pay | Admitting: Nurse Practitioner

## 2021-04-05 VITALS — BP 118/65 | HR 76 | Resp 16 | Ht 66.0 in | Wt 172.2 lb

## 2021-04-05 DIAGNOSIS — J449 Chronic obstructive pulmonary disease, unspecified: Secondary | ICD-10-CM

## 2021-04-05 DIAGNOSIS — R053 Chronic cough: Secondary | ICD-10-CM

## 2021-04-05 DIAGNOSIS — U099 Post covid-19 condition, unspecified: Secondary | ICD-10-CM | POA: Diagnosis not present

## 2021-04-05 DIAGNOSIS — R0602 Shortness of breath: Secondary | ICD-10-CM

## 2021-04-05 MED ORDER — TRELEGY ELLIPTA 100-62.5-25 MCG/ACT IN AEPB: 1.0000 | INHALATION_SPRAY | Freq: Every day | RESPIRATORY_TRACT | 11 refills | Status: DC

## 2021-04-05 MED ORDER — VENTOLIN HFA 108 (90 BASE) MCG/ACT IN AERS: 2.0000 | INHALATION_SPRAY | Freq: Four times a day (QID) | RESPIRATORY_TRACT | 5 refills | Status: DC | PRN

## 2021-04-05 NOTE — Progress Notes (Signed)
[facility] [address] [place], [place] [address]  Internal MEDICINE  Office Visit Note  Patient Name: Raven Ayers  [id number]  [id number]  Date of Service: 04/05/2021  Chief Complaint  Patient presents with   Follow-up   Asthma   COPD    HPI Raven Ayers presents for a follow up visit for COPD. She takes trelegy which she reports is working very well and she feels like she has been breathing better. Her spirometry showed a negative trend when compared to previous results. The FEV1, FVC and ratio were slightly worse than before. She did recently get over a covid infection. She did have a lingering cough and the changing weather can affect her breathing as well.     Current Medication: Outpatient Encounter Medications as of 04/05/2021  Medication Sig   azithromycin (ZITHROMAX) 250 MG tablet Take one tab a day for 10 days for uri   Fluticasone-Umeclidin-Vilant (TRELEGY ELLIPTA) 100-62.5-25 MCG/ACT AEPB Inhale 1 puff into the lungs daily.   predniSONE (DELTASONE) 10 MG tablet Take one tab 3 x day for 3 days, then take one tab 2 x a day for 3 days and then take one tab a day for 3 days for copd   [DISCONTINUED] TRELEGY ELLIPTA 100-62.5-25 MCG/INH AEPB INHALE 1 PUFF INTO THE LUNGS DAILY   [DISCONTINUED] VENTOLIN HFA 108 (90 Base) MCG/ACT inhaler Inhale 2 puffs into the lungs every 6 (six) hours as needed for wheezing or shortness of breath.   VENTOLIN HFA 108 (90 Base) MCG/ACT inhaler Inhale 2 puffs into the lungs every 6 (six) hours as needed for wheezing or shortness of breath.   No facility-administered encounter medications on file as of 04/05/2021.    Surgical History: Past Surgical History:  Procedure Laterality Date   FOOT SURGERY Left     Medical History: Past Medical History:  Diagnosis Date   Asthma     Family History: Family History  Problem Relation Age of Onset   Cancer Mother    Alzheimer's disease Father     Social History   Socioeconomic  History   Marital status: Widowed    Spouse name: Not on file   Number of children: Not on file   Years of education: Not on file   Highest education level: Not on file  Occupational History   Not on file  Tobacco Use   Smoking status: Former    Types: Cigarettes   Smokeless tobacco: Never   Tobacco comments:    10 years pt quit  Vaping Use   Vaping Use: Never used  Substance and Sexual Activity   Alcohol use: Yes    Comment: social   Drug use: Never   Sexual activity: Not on file  Other Topics Concern   Not on file  Social History Narrative   Not on file   Social Determinants of Health   Financial Resource Strain: Not on file  Food Insecurity: Not on file  Transportation Needs: Not on file  Physical Activity: Not on file  Stress: Not on file  Social Connections: Not on file  Intimate Partner Violence: Not on file      Review of Systems  Constitutional:  Negative for chills, fatigue and unexpected weight change.  HENT:  Negative for congestion, rhinorrhea, sneezing and sore throat.   Eyes:  Negative for redness.  Respiratory:  Positive for shortness of breath (intermittent, trelegy helps). Negative for cough, chest tightness and wheezing.   Cardiovascular: Negative.  Negative for chest  pain and palpitations.  Gastrointestinal:  Negative for abdominal pain, constipation, diarrhea, nausea and vomiting.  Genitourinary:  Negative for dysuria and frequency.  Musculoskeletal:  Negative for arthralgias, back pain, joint swelling and neck pain.  Skin:  Negative for rash.  Neurological: Negative.  Negative for tremors and numbness.  Hematological:  Negative for adenopathy. Does not bruise/bleed easily.  Psychiatric/Behavioral:  Negative for behavioral problems (Depression), sleep disturbance and suicidal ideas. The patient is not nervous/anxious.    Vital Signs: BP 118/65    Pulse 76    Resp 16    Ht 5\' 6"  (1.676 m)    Wt 172 lb 3.2 oz (78.1 kg)    SpO2 100%    BMI 27.79  kg/m    Physical Exam Vitals reviewed.  Constitutional:      General: She is not in acute distress.    Appearance: Normal appearance. She is normal weight. She is not ill-appearing.  HENT:     Head: Normocephalic and atraumatic.  Eyes:     Pupils: Pupils are equal, round, and reactive to light.  Cardiovascular:     Rate and Rhythm: Normal rate and regular rhythm.     Heart sounds: Normal heart sounds. No murmur heard. Pulmonary:     Effort: Pulmonary effort is normal. No respiratory distress.     Breath sounds: Normal breath sounds. No wheezing.  Neurological:     Mental Status: She is alert and oriented to person, place, and time.     Cranial Nerves: No cranial nerve deficit.     Coordination: Coordination normal.     Gait: Gait normal.  Psychiatric:        Mood and Affect: Mood normal.        Behavior: Behavior normal.       Assessment/Plan: 1. Chronic obstructive pulmonary disease, unspecified COPD type (HCC) Stable, uses trelegy ellipta, refills ordered - Fluticasone-Umeclidin-Vilant (TRELEGY ELLIPTA) 100-62.5-25 MCG/ACT AEPB; Inhale 1 puff into the lungs daily.  Dispense: 60 each; Refill: 11  2. SOB (shortness of breath) Spirometry done in office, results discussed with patient. Rescue inhaler refills sent to pharmacy. Follow up spirometry in 6 months.  - Spirometry with graph - VENTOLIN HFA 108 (90 Base) MCG/ACT inhaler; Inhale 2 puffs into the lungs every 6 (six) hours as needed for wheezing or shortness of breath.  Dispense: 8 g; Refill: 5  3. Post-COVID chronic cough Manageable with OTC medications and interventions.    General Counseling: immaculate crutcher understanding of the findings of todays visit and agrees with plan of treatment. I have discussed any further diagnostic evaluation that may be needed or ordered today. We also reviewed her medications today. she has been encouraged to call the office with any questions or concerns that should arise related to  todays visit.    Orders Placed This Encounter  Procedures   Spirometry with graph    Meds ordered this encounter  Medications   VENTOLIN HFA 108 (90 Base) MCG/ACT inhaler    Sig: Inhale 2 puffs into the lungs every 6 (six) hours as needed for wheezing or shortness of breath.    Dispense:  8 g    Refill:  5   Fluticasone-Umeclidin-Vilant (TRELEGY ELLIPTA) 100-62.5-25 MCG/ACT AEPB    Sig: Inhale 1 puff into the lungs daily.    Dispense:  60 each    Refill:  11    Return in about 6 months (around 10/04/2021) for F/U, pulmonary only, Georgena Weisheit PCP repeat spiro.   Total time  spent:30 Minutes Time spent includes review of chart, medications, test results, and follow up plan with the patient.   [place] Controlled Substance Database was reviewed by me.  This patient was seen by Sallyanne Kuster, FNP-C in collaboration with Dr. Beverely Risen as a part of collaborative care agreement.   Brileigh Sevcik R. Tedd Sias, MSN, FNP-C Internal medicine

## 2021-04-23 ENCOUNTER — Other Ambulatory Visit: Payer: Self-pay

## 2021-04-23 DIAGNOSIS — R0602 Shortness of breath: Secondary | ICD-10-CM

## 2021-04-23 NOTE — Telephone Encounter (Signed)
Pt was advised that the ventolin inhaler rx was written for DAW

## 2021-10-04 ENCOUNTER — Ambulatory Visit: Payer: 59 | Admitting: Nurse Practitioner

## 2021-10-10 ENCOUNTER — Ambulatory Visit: Payer: 59 | Admitting: Nurse Practitioner

## 2021-11-06 ENCOUNTER — Encounter: Payer: Self-pay | Admitting: Nurse Practitioner

## 2021-11-06 ENCOUNTER — Ambulatory Visit: Payer: PRIVATE HEALTH INSURANCE | Admitting: Nurse Practitioner

## 2021-11-06 VITALS — BP 120/66 | HR 80 | Temp 98.0°F | Resp 16 | Ht 66.0 in | Wt 177.6 lb

## 2021-11-06 DIAGNOSIS — R0602 Shortness of breath: Secondary | ICD-10-CM

## 2021-11-06 DIAGNOSIS — I10 Essential (primary) hypertension: Secondary | ICD-10-CM

## 2021-11-06 DIAGNOSIS — J449 Chronic obstructive pulmonary disease, unspecified: Secondary | ICD-10-CM | POA: Diagnosis not present

## 2021-11-06 MED ORDER — VENTOLIN HFA 108 (90 BASE) MCG/ACT IN AERS: 2.0000 | INHALATION_SPRAY | Freq: Four times a day (QID) | RESPIRATORY_TRACT | 5 refills | Status: DC | PRN

## 2021-11-06 NOTE — Progress Notes (Signed)
[facility] [address] [place], [place] [address]  Internal MEDICINE  Office Visit Note  Patient Name: Raven Ayers  [id number]  [id number]  Date of Service: 11/06/2021  Chief Complaint  Patient presents with   Nasal Congestion    Has not felt well for the past few weeks   Asthma   Follow-up    HPI Raven Ayers presents for a follow-up visit for COPD.  She had a spirometry done in office today and her FVC declined by approximately 5% since her previous spirometry 6 months ago, but her FEV1 improved by about 5% and her FVC/FEV1 ratio also significantly improved by approximately 13%.  Overall she reports that her respiratory status is stable and she denies any significant shortness of breath, cough, wheezing or chest tightness.  She does report that her environmental allergies have been bothering her more lately and Claritin is not helping.  We did discuss over-the-counter nasal steroid sprays and she states she will try 1 of those. She does need refills of the brand-name Ventolin inhaler for her rescue inhaler.  She does not need refills on Trelegy right now but states that Trelegy has been very helpful in improving her breathing. Her blood pressure and other vital signs are stable and within normal limits.  She has no other issues today although her health insurance is changing due to her employer switching insurance companies and she is worried that her PCP is not covered under her new insurance. She states that she will call and find out out for sure.   Current Medication: Outpatient Encounter Medications as of 11/06/2021  Medication Sig   Fluticasone-Umeclidin-Vilant (TRELEGY ELLIPTA) 100-62.5-25 MCG/ACT AEPB Inhale 1 puff into the lungs daily.   predniSONE (DELTASONE) 10 MG tablet Take one tab 3 x day for 3 days, then take one tab 2 x a day for 3 days and then take one tab a day for 3 days for copd   [DISCONTINUED] azithromycin (ZITHROMAX) 250 MG tablet Take one tab a day for  10 days for uri   [DISCONTINUED] VENTOLIN HFA 108 (90 Base) MCG/ACT inhaler Inhale 2 puffs into the lungs every 6 (six) hours as needed for wheezing or shortness of breath.   VENTOLIN HFA 108 (90 Base) MCG/ACT inhaler Inhale 2 puffs into the lungs every 6 (six) hours as needed for wheezing or shortness of breath.   No facility-administered encounter medications on file as of 11/06/2021.    Surgical History: Past Surgical History:  Procedure Laterality Date   FOOT SURGERY Left     Medical History: Past Medical History:  Diagnosis Date   Asthma     Family History: Family History  Problem Relation Age of Onset   Cancer Mother    Alzheimer's disease Father     Social History   Socioeconomic History   Marital status: Widowed    Spouse name: Not on file   Number of children: Not on file   Years of education: Not on file   Highest education level: Not on file  Occupational History   Not on file  Tobacco Use   Smoking status: Former    Types: Cigarettes   Smokeless tobacco: Never   Tobacco comments:    10 years pt quit  Vaping Use   Vaping Use: Never used  Substance and Sexual Activity   Alcohol use: Yes    Comment: social   Drug use: Never   Sexual activity: Not on file  Other Topics Concern  Not on file  Social History Narrative   Not on file   Social Determinants of Health   Financial Resource Strain: Not on file  Food Insecurity: Not on file  Transportation Needs: Not on file  Physical Activity: Not on file  Stress: Not on file  Social Connections: Not on file  Intimate Partner Violence: Not on file      Review of Systems  Constitutional:  Negative for chills, fatigue and unexpected weight change.  HENT:  Negative for congestion, rhinorrhea, sneezing and sore throat.   Eyes:  Negative for redness.  Respiratory:  Positive for shortness of breath (intermittent, trelegy helps). Negative for cough, chest tightness and wheezing.   Cardiovascular:  Negative.  Negative for chest pain and palpitations.  Gastrointestinal:  Negative for abdominal pain, constipation, diarrhea, nausea and vomiting.  Genitourinary:  Negative for dysuria and frequency.  Musculoskeletal:  Negative for arthralgias, back pain, joint swelling and neck pain.  Skin:  Negative for rash.  Neurological: Negative.  Negative for tremors and numbness.  Hematological:  Negative for adenopathy. Does not bruise/bleed easily.  Psychiatric/Behavioral:  Negative for behavioral problems (Depression), sleep disturbance and suicidal ideas. The patient is not nervous/anxious.     Vital Signs: BP 120/66   Pulse 80   Temp 98 F (36.7 C)   Resp 16   Ht 5\' 6"  (1.676 m)   Wt 177 lb 9.6 oz (80.6 kg)   SpO2 97%   BMI 28.67 kg/m    Physical Exam Vitals reviewed.  Constitutional:      General: She is not in acute distress.    Appearance: Normal appearance. She is normal weight. She is not ill-appearing.  HENT:     Head: Normocephalic and atraumatic.  Eyes:     Pupils: Pupils are equal, round, and reactive to light.  Cardiovascular:     Rate and Rhythm: Normal rate and regular rhythm.     Heart sounds: Normal heart sounds. No murmur heard. Pulmonary:     Effort: Pulmonary effort is normal. No respiratory distress.     Breath sounds: Normal breath sounds. No wheezing.  Neurological:     Mental Status: She is alert and oriented to person, place, and time.     Cranial Nerves: No cranial nerve deficit.     Coordination: Coordination normal.     Gait: Gait normal.  Psychiatric:        Mood and Affect: Mood normal.        Behavior: Behavior normal.        Assessment/Plan: 1. Chronic obstructive pulmonary disease, unspecified COPD type (HCC) Continue trelegy as prescribed. Follow up in 6 months.   2. Shortness of breath Improving, refills ordered.  - Spirometry with Graph - VENTOLIN HFA 108 (90 Base) MCG/ACT inhaler; Inhale 2 puffs into the lungs every 6 (six)  hours as needed for wheezing or shortness of breath.  Dispense: 8 g; Refill: 5  3. Essential hypertension Stable.   General Counseling: Raven Ayers understanding of the findings of todays visit and agrees with plan of treatment. I have discussed any further diagnostic evaluation that may be needed or ordered today. We also reviewed her medications today. she has been encouraged to call the office with any questions or concerns that should arise related to todays visit.    Orders Placed This Encounter  Procedures   Spirometry with Graph    Meds ordered this encounter  Medications   VENTOLIN HFA 108 (90 Base) MCG/ACT inhaler  Sig: Inhale 2 puffs into the lungs every 6 (six) hours as needed for wheezing or shortness of breath.    Dispense:  8 g    Refill:  5    MUST BE BRAND NAME, patient willing to pay regardless of insurance. Patient will call when she needs this medication filled.    Return in about 6 months (around 05/09/2022) for F/U, pulmonary only, Maddox Bratcher PCP.   Total time spent:30 Minutes Time spent includes review of chart, medications, test results, and follow up plan with the patient.   [place] Controlled Substance Database was reviewed by me.  This patient was seen by Sallyanne Kuster, FNP-C in collaboration with Dr. Beverely Risen as a part of collaborative care agreement.   Cecia Egge R. Tedd Sias, MSN, FNP-C Internal medicine

## 2021-11-12 ENCOUNTER — Encounter: Payer: Self-pay | Admitting: Nurse Practitioner

## 2022-03-15 ENCOUNTER — Telehealth: Payer: Self-pay | Admitting: Nurse Practitioner

## 2022-03-15 NOTE — Telephone Encounter (Signed)
Lvm notifying patient of pft appointment for 05/01/22 @ 1:30-Toni

## 2022-04-27 ENCOUNTER — Other Ambulatory Visit: Payer: Self-pay | Admitting: Nurse Practitioner

## 2022-04-27 DIAGNOSIS — J449 Chronic obstructive pulmonary disease, unspecified: Secondary | ICD-10-CM

## 2022-05-01 ENCOUNTER — Ambulatory Visit: Payer: PRIVATE HEALTH INSURANCE | Admitting: Internal Medicine

## 2022-05-09 ENCOUNTER — Encounter: Payer: Self-pay | Admitting: Nurse Practitioner

## 2022-05-09 ENCOUNTER — Ambulatory Visit: Payer: PRIVATE HEALTH INSURANCE | Admitting: Nurse Practitioner

## 2022-05-09 VITALS — BP 133/73 | HR 85 | Temp 97.2°F | Resp 16 | Ht 66.0 in | Wt 181.2 lb

## 2022-05-09 DIAGNOSIS — R0602 Shortness of breath: Secondary | ICD-10-CM

## 2022-05-09 DIAGNOSIS — J449 Chronic obstructive pulmonary disease, unspecified: Secondary | ICD-10-CM | POA: Diagnosis not present

## 2022-05-09 NOTE — Progress Notes (Signed)
[facility] [place], [place] [address]  Internal MEDICINE  Office Visit Note  Patient Name: Raven Ayers  [id number]  [id number]  Date of Service: 05/09/2022  Chief Complaint  Patient presents with   Follow-up    HPI Raven Ayers presents for a follow-up visit for COPD and SOB Breathing is ok, no significant change, unable to do spirometry today, declined PFT for now. Has enoguh refills of trelegy and ventolin inhalers.  Reports having a cold recently.  Wants to lose weight -- gave patient information about OTC options but encouraged patient to discuss with PCP if interested in prescription weight loss medications since she comes to [facility] for pulmonary.     Current Medication: Outpatient Encounter Medications as of 05/09/2022  Medication Sig   predniSONE (DELTASONE) 10 MG tablet Take one tab 3 x day for 3 days, then take one tab 2 x a day for 3 days and then take one tab a day for 3 days for copd   TRELEGY ELLIPTA 100-62.5-25 MCG/ACT AEPB INHALE 1 PUFF INTO THE LUNGS DAILY   VENTOLIN HFA 108 (90 Base) MCG/ACT inhaler Inhale 2 puffs into the lungs every 6 (six) hours as needed for wheezing or shortness of breath.   No facility-administered encounter medications on file as of 05/09/2022.    Surgical History: Past Surgical History:  Procedure Laterality Date   FOOT SURGERY Left     Medical History: Past Medical History:  Diagnosis Date   Asthma     Family History: Family History  Problem Relation Age of Onset   Cancer Mother    Alzheimer's disease Father     Social History   Socioeconomic History   Marital status: Widowed    Spouse name: Not on file   Number of children: Not on file   Years of education: Not on file   Highest education level: Not on file  Occupational History   Not on file  Tobacco Use   Smoking status: Former    Types: Cigarettes   Smokeless tobacco: Never   Tobacco comments:    10 years pt quit  Vaping Use    Vaping Use: Never used  Substance and Sexual Activity   Alcohol use: Yes    Comment: social   Drug use: Never   Sexual activity: Not on file  Other Topics Concern   Not on file  Social History Narrative   Not on file   Social Determinants of Health   Financial Resource Strain: Not on file  Food Insecurity: Not on file  Transportation Needs: Not on file  Physical Activity: Not on file  Stress: Not on file  Social Connections: Not on file  Intimate Partner Violence: Not on file      Review of Systems  Constitutional:  Negative for chills, fatigue and unexpected weight change.  HENT:  Negative for congestion, rhinorrhea, sneezing and sore throat.   Eyes:  Negative for redness.  Respiratory:  Positive for shortness of breath (intermittent, trelegy helps). Negative for cough, chest tightness and wheezing.   Cardiovascular: Negative.  Negative for chest pain and palpitations.  Gastrointestinal:  Negative for abdominal pain, constipation, diarrhea, nausea and vomiting.  Genitourinary:  Negative for dysuria and frequency.  Musculoskeletal:  Negative for arthralgias, back pain, joint swelling and neck pain.  Skin:  Negative for rash.  Neurological: Negative.  Negative for tremors and numbness.  Hematological:  Negative for adenopathy. Does not bruise/bleed easily.  Psychiatric/Behavioral:  Negative for behavioral  problems (Depression), sleep disturbance and suicidal ideas. The patient is not nervous/anxious.     Vital Signs: BP 133/73   Pulse 85   Temp (!) 97.2 F (36.2 C)   Resp 16   Ht 5\' 6"  (1.676 m)   Wt 181 lb 3.2 oz (82.2 kg)   SpO2 98%   BMI 29.25 kg/m    Physical Exam Vitals reviewed.  Constitutional:      General: She is not in acute distress.    Appearance: Normal appearance. She is normal weight. She is not ill-appearing.  HENT:     Head: Normocephalic and atraumatic.  Eyes:     Pupils: Pupils are equal, round, and reactive to light.  Cardiovascular:      Rate and Rhythm: Normal rate and regular rhythm.     Heart sounds: Normal heart sounds. No murmur heard. Pulmonary:     Effort: Pulmonary effort is normal. No accessory muscle usage or respiratory distress.     Breath sounds: Normal air entry. No decreased air movement. Examination of the right-upper field reveals wheezing. Examination of the left-upper field reveals wheezing. Examination of the right-middle field reveals wheezing. Wheezing (minimal) present.  Neurological:     Mental Status: She is alert and oriented to person, place, and time.     Cranial Nerves: No cranial nerve deficit.     Coordination: Coordination normal.     Gait: Gait normal.  Psychiatric:        Mood and Affect: Mood normal.        Behavior: Behavior normal.        Assessment/Plan: 1. Chronic obstructive pulmonary disease, unspecified COPD type ([place]) Continue trelegy inhaler daily as prescribed. Will readdress PFT at next office visit.   2. Shortness of breath Continue ventolin inhaler prn as prescribed.    General Counseling: zamyra allensworth understanding of the findings of todays visit and agrees with plan of treatment. I have discussed any further diagnostic evaluation that may be needed or ordered today. We also reviewed her medications today. she has been encouraged to call the office with any questions or concerns that should arise related to todays visit.    No orders of the defined types were placed in this encounter.   No orders of the defined types were placed in this encounter.   Return in about 6 months (around 11/07/2022) for F/U, pulmonary only, [place] Hair or DSK.   Total time spent:20 Minutes Time spent includes review of chart, medications, test results, and follow up plan with the patient.   [place] Controlled Substance Database was reviewed by me.  This patient was seen by Jonetta Osgood, FNP-C in collaboration with Dr. Clayborn Bigness as a part of collaborative care agreement.   Raven Ayers  R. Valetta Fuller, MSN, FNP-C Internal medicine

## 2022-11-07 ENCOUNTER — Ambulatory Visit: Payer: PRIVATE HEALTH INSURANCE | Admitting: Nurse Practitioner

## 2022-12-03 ENCOUNTER — Encounter: Payer: Self-pay | Admitting: Nurse Practitioner

## 2022-12-03 ENCOUNTER — Ambulatory Visit: Payer: PRIVATE HEALTH INSURANCE | Admitting: Nurse Practitioner

## 2022-12-03 DIAGNOSIS — J449 Chronic obstructive pulmonary disease, unspecified: Secondary | ICD-10-CM

## 2022-12-03 DIAGNOSIS — R0602 Shortness of breath: Secondary | ICD-10-CM | POA: Diagnosis not present

## 2022-12-03 DIAGNOSIS — J439 Emphysema, unspecified: Secondary | ICD-10-CM | POA: Insufficient documentation

## 2022-12-03 MED ORDER — VENTOLIN HFA 108 (90 BASE) MCG/ACT IN AERS: 2.0000 | INHALATION_SPRAY | Freq: Four times a day (QID) | RESPIRATORY_TRACT | 5 refills | Status: DC | PRN

## 2022-12-03 MED ORDER — TRELEGY ELLIPTA 100-62.5-25 MCG/ACT IN AEPB: 1.0000 | INHALATION_SPRAY | Freq: Every day | RESPIRATORY_TRACT | 11 refills | Status: DC

## 2022-12-03 NOTE — Progress Notes (Signed)
[facility] [address] [place], [place] [address]  Internal MEDICINE  Office Visit Note  Patient Name: Raven Ayers  [id number]  [id number]  Date of Service: 12/03/2022  Chief Complaint  Patient presents with   Follow-up    HPI Raven Ayers presents for a follow-up visit for COPD -- no significant changes, had mild URI a couple of weeks ago but it is resolving.  Using rescue inhaler once a week or less Need spiro done at next office visit.    Current Medication: Outpatient Encounter Medications as of 12/03/2022  Medication Sig   buPROPion (WELLBUTRIN XL) 300 MG 24 hr tablet Take by mouth.   losartan (COZAAR) 50 MG tablet Take 1 tablet by mouth daily.   Multiple Vitamin (MULTIVITAMIN) capsule Take by mouth.   nystatin (MYCOSTATIN) 100000 UNIT/ML suspension SWISH AND SWALLOW 5 ML FOUR TIMES DAILY FOR 10 DAYS as needed   [DISCONTINUED] predniSONE (DELTASONE) 10 MG tablet Take one tab 3 x day for 3 days, then take one tab 2 x a day for 3 days and then take one tab a day for 3 days for copd   [DISCONTINUED] TRELEGY ELLIPTA 100-62.5-25 MCG/ACT AEPB INHALE 1 PUFF INTO THE LUNGS DAILY   [DISCONTINUED] VENTOLIN HFA 108 (90 Base) MCG/ACT inhaler Inhale 2 puffs into the lungs every 6 (six) hours as needed for wheezing or shortness of breath.   Fluticasone-Umeclidin-Vilant (TRELEGY ELLIPTA) 100-62.5-25 MCG/ACT AEPB Inhale 1 puff into the lungs daily.   VENTOLIN HFA 108 (90 Base) MCG/ACT inhaler Inhale 2 puffs into the lungs every 6 (six) hours as needed for wheezing or shortness of breath.   No facility-administered encounter medications on file as of 12/03/2022.    Surgical History: Past Surgical History:  Procedure Laterality Date   FOOT SURGERY Left     Medical History: Past Medical History:  Diagnosis Date   Asthma     Family History: Family History  Problem Relation Age of Onset   Cancer Mother    Alzheimer's disease Father     Social History    Socioeconomic History   Marital status: Widowed    Spouse name: Not on file   Number of children: Not on file   Years of education: Not on file   Highest education level: Not on file  Occupational History   Not on file  Tobacco Use   Smoking status: Former    Types: Cigarettes   Smokeless tobacco: Never   Tobacco comments:    10 years pt quit  Vaping Use   Vaping status: Never Used  Substance and Sexual Activity   Alcohol use: Yes    Comment: social   Drug use: Never   Sexual activity: Not on file  Other Topics Concern   Not on file  Social History Narrative   Not on file   Social Determinants of Health   Financial Resource Strain: Medium Risk (06/24/2022)   Received from [facility], [facility]   Overall Financial Resource Strain (CARDIA)    Difficulty of Paying Living Expenses: Somewhat hard  Food Insecurity: Patient Declined (06/24/2022)   Received from [facility], [facility]   Hunger Vital Sign    Worried About Running Out of Food in the Last Year: Patient declined    Ran Out of Food in the Last Year: Patient declined  Transportation Needs: No Transportation Needs (06/24/2022)   Received from [facility], [facility]  PRAPARE - Transportation    In the past 12 months, has lack of transportation kept you from medical appointments or from getting medications?: No    Lack of Transportation (Non-Medical): No  Physical Activity: Not on file  Stress: Not on file  Social Connections: Not on file  Intimate Partner Violence: Not on file      Review of Systems  Constitutional:  Negative for chills, fatigue and unexpected weight change.  HENT:  Negative for congestion, rhinorrhea, sneezing and sore throat.   Eyes:  Negative for redness.  Respiratory:  Positive for shortness of breath (intermittent, trelegy helps). Negative for cough, chest tightness and  wheezing.   Cardiovascular: Negative.  Negative for chest pain and palpitations.  Gastrointestinal:  Negative for abdominal pain, constipation, diarrhea, nausea and vomiting.  Genitourinary:  Negative for dysuria and frequency.  Musculoskeletal:  Negative for arthralgias, back pain, joint swelling and neck pain.  Skin:  Negative for rash.  Neurological: Negative.  Negative for tremors and numbness.  Hematological:  Negative for adenopathy. Does not bruise/bleed easily.  Psychiatric/Behavioral:  Negative for behavioral problems (Depression), sleep disturbance and suicidal ideas. The patient is not nervous/anxious.     Vital Signs: BP 126/68   Pulse 82   Temp 98.3 F (36.8 C)   Resp 16   Ht 5\' 6"  (1.676 m)   Wt 172 lb 4.8 oz (78.2 kg)   SpO2 94%   BMI 27.81 kg/m    Physical Exam Vitals reviewed.  Constitutional:      General: She is not in acute distress.    Appearance: Normal appearance. She is not ill-appearing.  HENT:     Head: Normocephalic and atraumatic.  Eyes:     Pupils: Pupils are equal, round, and reactive to light.  Cardiovascular:     Rate and Rhythm: Normal rate and regular rhythm.     Heart sounds: Normal heart sounds. No murmur heard. Pulmonary:     Effort: Pulmonary effort is normal.     Breath sounds: Examination of the left-upper field reveals wheezing. Examination of the right-middle field reveals wheezing. Examination of the left-middle field reveals wheezing. Examination of the right-lower field reveals wheezing. Examination of the left-lower field reveals wheezing. Wheezing (slight inspiratory wheezing) present.  Neurological:     Mental Status: She is alert and oriented to person, place, and time.  Psychiatric:        Mood and Affect: Mood normal.        Behavior: Behavior normal.        Assessment/Plan: 1. Chronic obstructive pulmonary disease, unspecified COPD type (HCC) Continue trelegy as prescribed. Refills ordered -  Fluticasone-Umeclidin-Vilant (TRELEGY ELLIPTA) 100-62.5-25 MCG/ACT AEPB; Inhale 1 puff into the lungs daily.  Dispense: 60 each; Refill: 11  2. Shortness of breath Continue prn ventolin inhaler as prescribed. Refills ordered - VENTOLIN HFA 108 (90 Base) MCG/ACT inhaler; Inhale 2 puffs into the lungs every 6 (six) hours as needed for wheezing or shortness of breath.  Dispense: 8 g; Refill: 5   General Counseling: Lucielle verbalizes understanding of the findings of todays visit and agrees with plan of treatment. I have discussed any further diagnostic evaluation that may be needed or ordered today. We also reviewed her medications today. she has been encouraged to call the office with any questions or concerns that should arise related to todays visit.    No orders of the defined types were placed in this encounter.   Meds ordered this encounter  Medications  Fluticasone-Umeclidin-Vilant (TRELEGY ELLIPTA) 100-62.5-25 MCG/ACT AEPB    Sig: Inhale 1 puff into the lungs daily.    Dispense:  60 each    Refill:  11   VENTOLIN HFA 108 (90 Base) MCG/ACT inhaler    Sig: Inhale 2 puffs into the lungs every 6 (six) hours as needed for wheezing or shortness of breath.    Dispense:  8 g    Refill:  5    MUST BE BRAND NAME, patient willing to pay regardless of insurance. Patient will call when she needs this medication filled.    Return in about 6 months (around 06/05/2023) for F/U, pulmonary only, Yolander Goodie NEED SPIRO at next visit. .   Total time spent:20 Minutes Time spent includes review of chart, medications, test results, and follow up plan with the patient.   [place] Controlled Substance Database was reviewed by me.  This patient was seen by Sallyanne Kuster, FNP-C in collaboration with Dr. Beverely Risen as a part of collaborative care agreement.   Yanelie Abraha R. Tedd Sias, MSN, FNP-C Internal medicine

## 2023-01-24 ENCOUNTER — Other Ambulatory Visit: Payer: Self-pay | Admitting: Family Medicine

## 2023-01-24 DIAGNOSIS — M2550 Pain in unspecified joint: Secondary | ICD-10-CM

## 2023-01-24 DIAGNOSIS — G8929 Other chronic pain: Secondary | ICD-10-CM

## 2023-05-14 ENCOUNTER — Other Ambulatory Visit: Payer: Self-pay | Admitting: Nurse Practitioner

## 2023-05-14 DIAGNOSIS — J449 Chronic obstructive pulmonary disease, unspecified: Secondary | ICD-10-CM

## 2023-06-05 ENCOUNTER — Encounter: Payer: Self-pay | Admitting: Nurse Practitioner

## 2023-06-05 ENCOUNTER — Ambulatory Visit (INDEPENDENT_AMBULATORY_CARE_PROVIDER_SITE_OTHER): Payer: PRIVATE HEALTH INSURANCE | Admitting: Nurse Practitioner

## 2023-06-05 VITALS — BP 138/78 | HR 84 | Temp 96.1°F | Resp 16 | Ht 66.0 in | Wt 168.8 lb

## 2023-06-05 DIAGNOSIS — R0602 Shortness of breath: Secondary | ICD-10-CM

## 2023-06-05 DIAGNOSIS — J449 Chronic obstructive pulmonary disease, unspecified: Secondary | ICD-10-CM

## 2023-06-05 MED ORDER — PREDNISONE 10 MG (21) PO TBPK: ORAL_TABLET | ORAL | 0 refills | Status: AC

## 2023-06-05 MED ORDER — TRELEGY ELLIPTA 100-62.5-25 MCG/ACT IN AEPB: 1.0000 | INHALATION_SPRAY | Freq: Every day | RESPIRATORY_TRACT | 11 refills | Status: DC

## 2023-06-05 NOTE — Progress Notes (Signed)
 [facility] [address] [place], [place] [address]  Internal MEDICINE  Office Visit Note  Patient Name: Raven Ayers  [id number]  [id number]  Date of Service: 06/05/2023  Chief Complaint  Patient presents with   Follow-up    HPI Raven Ayers presents for a follow-up visit for COPD and SOB.  COPD -- spiro done in office , shows severe COPD. Taking trelegy daily. Not needing albuterol inhaler as much. Still feels like her breathing could be more improved. Has not tried any other inhalers or neb treatments.  SOB -- in office spirometry done today. Patient has previously refused and continues to decline to have a PFT done.     Current Medication: Outpatient Encounter Medications as of 06/05/2023  Medication Sig   buPROPion (WELLBUTRIN XL) 300 MG 24 hr tablet Take by mouth.   losartan (COZAAR) 50 MG tablet Take 1 tablet by mouth daily.   Multiple Vitamin (MULTIVITAMIN) capsule Take by mouth.   nystatin (MYCOSTATIN) 100000 UNIT/ML suspension SWISH AND SWALLOW 5 ML FOUR TIMES DAILY FOR 10 DAYS as needed   predniSONE (STERAPRED UNI-PAK 21 TAB) 10 MG (21) TBPK tablet Use as directed for 6 days   VENTOLIN HFA 108 (90 Base) MCG/ACT inhaler Inhale 2 puffs into the lungs every 6 (six) hours as needed for wheezing or shortness of breath.   [DISCONTINUED] TRELEGY ELLIPTA 100-62.5-25 MCG/ACT AEPB INHALE 1 PUFF INTO THE LUNGS DAILY   Fluticasone-Umeclidin-Vilant (TRELEGY ELLIPTA) 100-62.5-25 MCG/ACT AEPB Inhale 1 puff into the lungs daily.   No facility-administered encounter medications on file as of 06/05/2023.    Surgical History: Past Surgical History:  Procedure Laterality Date   FOOT SURGERY Left     Medical History: Past Medical History:  Diagnosis Date   Asthma     Family History: Family History  Problem Relation Age of Onset   Cancer Mother    Alzheimer's disease Father     Social History   Socioeconomic History   Marital status: Widowed    Spouse name: Not  on file   Number of children: Not on file   Years of education: Not on file   Highest education level: Not on file  Occupational History   Not on file  Tobacco Use   Smoking status: Former    Types: Cigarettes   Smokeless tobacco: Never   Tobacco comments:    10 years pt quit  Vaping Use   Vaping status: Never Used  Substance and Sexual Activity   Alcohol use: Yes    Comment: social   Drug use: Never   Sexual activity: Not on file  Other Topics Concern   Not on file  Social History Narrative   Not on file   Social Drivers of Health   Financial Resource Strain: Medium Risk (06/24/2022)   Received from [facility], [facility]   Overall Financial Resource Strain (CARDIA)    Difficulty of Paying Living Expenses: Somewhat hard  Food Insecurity: Patient Declined (06/24/2022)   Received from [facility], [facility]   Hunger Vital Sign    Worried About Running Out of Food in the Last Year: Patient declined    Ran Out of Food in the Last Year: Patient declined  Transportation Needs: No Transportation Needs (06/24/2022)   Received from [facility], [facility]   PRAPARE - Transportation    In the past 12 months, has lack of transportation kept you from medical  appointments or from getting medications?: No    Lack of Transportation (Non-Medical): No  Physical Activity: Not on file  Stress: Not on file  Social Connections: Not on file  Intimate Partner Violence: Not on file      Review of Systems  Constitutional:  Negative for chills, fatigue and unexpected weight change.  HENT:  Negative for congestion, rhinorrhea, sneezing and sore throat.   Eyes:  Negative for redness.  Respiratory:  Positive for shortness of breath (intermittent, trelegy helps). Negative for cough, chest tightness and wheezing.   Cardiovascular: Negative.  Negative for chest pain and palpitations.   Gastrointestinal:  Negative for abdominal pain, constipation, diarrhea, nausea and vomiting.  Genitourinary:  Negative for dysuria and frequency.  Musculoskeletal:  Negative for arthralgias, back pain, joint swelling and neck pain.  Skin:  Negative for rash.  Neurological: Negative.  Negative for tremors and numbness.  Hematological:  Negative for adenopathy. Does not bruise/bleed easily.  Psychiatric/Behavioral:  Negative for behavioral problems (Depression), sleep disturbance and suicidal ideas. The patient is not nervous/anxious.     Vital Signs: BP 138/78   Pulse 84   Temp (!) 96.1 F (35.6 C)   Resp 16   Ht 5\' 6"  (1.676 m)   Wt 168 lb 12.8 oz (76.6 kg)   SpO2 96%   PF (!) 2 L/min   BMI 27.25 kg/m    Physical Exam Vitals reviewed.  Constitutional:      General: She is not in acute distress.    Appearance: Normal appearance. She is not ill-appearing.  HENT:     Head: Normocephalic and atraumatic.  Eyes:     Pupils: Pupils are equal, round, and reactive to light.  Cardiovascular:     Rate and Rhythm: Normal rate and regular rhythm.     Heart sounds: Normal heart sounds. No murmur heard. Pulmonary:     Effort: Pulmonary effort is normal. No respiratory distress.     Breath sounds: Normal air entry. No decreased air movement. Examination of the right-lower field reveals decreased breath sounds. Examination of the left-lower field reveals decreased breath sounds. Decreased breath sounds present. No wheezing.  Neurological:     Mental Status: She is alert and oriented to person, place, and time.  Psychiatric:        Mood and Affect: Mood normal.        Behavior: Behavior normal.        Assessment/Plan: 1. Chronic obstructive pulmonary disease, unspecified COPD type (HCC) (Primary) Continue trelegy inhaler as prescribed. Prednisone prescribed, take until gone. Forms for prescribing [place] completed and will be [facility]ed to [facility] -  Fluticasone-Umeclidin-Vilant (TRELEGY ELLIPTA) 100-62.5-25 MCG/ACT AEPB; Inhale 1 puff into the lungs daily.  Dispense: 60 each; Refill: 11 - predniSONE (STERAPRED UNI-PAK 21 TAB) 10 MG (21) TBPK tablet; Use as directed for 6 days  Dispense: 21 tablet; Refill: 0  2. Shortness of breath Spirometry done today. Declined PFT. Prednisone taper prescribed, take until gone - Spirometry with Graph - predniSONE (STERAPRED UNI-PAK 21 TAB) 10 MG (21) TBPK tablet; Use as directed for 6 days  Dispense: 21 tablet; Refill: 0   General Counseling: stpehanie montroy understanding of the findings of todays visit and agrees with plan of treatment. I have discussed any further diagnostic evaluation that may be needed or ordered today. We also reviewed her medications today. she has been encouraged to call the office with any questions or concerns that should arise related to todays visit.  Orders Placed This Encounter  Procedures   Spirometry with Graph    Meds ordered this encounter  Medications   Fluticasone-Umeclidin-Vilant (TRELEGY ELLIPTA) 100-62.5-25 MCG/ACT AEPB    Sig: Inhale 1 puff into the lungs daily.    Dispense:  60 each    Refill:  11   predniSONE (STERAPRED UNI-PAK 21 TAB) 10 MG (21) TBPK tablet    Sig: Use as directed for 6 days    Dispense:  21 tablet    Refill:  0    Return in about 6 months (around 12/03/2023) for F/U, pulmonary only, Alika Eppes PCP.   Total time spent:20 Minutes Time spent includes review of chart, medications, test results, and follow up plan with the patient.   [place] Controlled Substance Database was reviewed by me.  This patient was seen by Sallyanne Kuster, FNP-C in collaboration with Dr. Beverely Risen as a part of collaborative care agreement.   Jeweldean Drohan R. Tedd Sias, MSN, FNP-C Internal medicine

## 2023-09-25 ENCOUNTER — Encounter: Payer: Self-pay | Admitting: Nurse Practitioner

## 2023-10-31 NOTE — Progress Notes (Signed)
 Chief Complaint  Patient presents with  . Annual Exam  . Hypertension    Subjective:   Raven Ayers is a 63 y.o. female here for a health maintenance visit.  Patient is an established pt Additional concerns addressed today:   HPI History of Present Illness Raven Ayers is a 63 year old female who presents for an annual physical exam.  She is due for blood work, including kidney and liver function tests, a metabolic panel, and a thyroid check. She has a history of thyroid issues and was previously on Synthroid, which normalized her thyroid levels.  She has experienced a weight loss of seven pounds since March, which she attributes to eating more salads due to the hot weather. Her current weight is 161 pounds, down from 168 pounds in March. Her diet includes a high intake of vegetables and peanut butter to satisfy sweet cravings. She acknowledges the need for more exercise, although she walks frequently at work.  She is currently taking Wellbutrin, which she finds effective, and meloxicam, which she reserves for severe pain. She has not used meloxicam recently due to receiving a cortisone shot in her shoulder in March, which provided significant pain relief.  She has a history of low sodium levels for the past 13 years and consumes a large amount of water, which may contribute to this. She has reduced her salt intake while losing weight.  She has been on losartan for less than a year. She uses inhalers for respiratory issues and has an appointment with her pulmonologist next month. No chest pain or blood in her stool. She uses ibuprofen as needed.  She has not been to the dentist recently and is seeking a new provider. Her last eye exam was 18 months ago, and she is considering options for a new eye doctor.  HTN - losartan 50 mg.  Tolerating medication without issue.  Periodically will check her blood pressure at home.   BP Readings from Last 3 Encounters:  10/31/23  119/68  07/01/23 128/72  01/22/23 116/70   Chronic hyponatremia - chronic hyponatremia with sodiums from 131-134 since 2012.   Patient reports that she consumes approximately five 20oz tumblers of water per day.    COPD - Trelegy, albuterol .  Follows with [facility] pulmonology and sees them every 6 months.     Subclinical Hypothyroidism  Not on medication. Denies unexplained fatigue, weight changes, heat/cold intolerance, bowel/skin changes  Lab Results  Component Value Date   TSH 5.44 06/24/2022     Patient Active Problem List  Diagnosis  . Asthma without status asthmaticus (HHS-HCC)  . Emphysema of lung (CMS/HHS-HCC)  . Nocturnal leg cramps  . Essential hypertension  . Subclinical hypothyroidism  . Chronic hyponatremia    Outpatient Medications Prior to Visit  Medication Sig Dispense Refill  . albuterol  90 mcg/actuation inhaler Inhale 2 puffs by mouth every 4 hours as needed for wheezing 18 g 0  . IBUPROFEN ORAL Take by mouth    . losartan (COZAAR) 50 MG tablet TAKE 1 TABLET(50 MG) BY MOUTH DAILY 30 tablet 11  . nystatin (MYCOSTATIN) 100,000 unit/mL suspension SWISH AND SWALLOW 5 ML FOUR TIMES DAILY FOR 10 DAYS as needed 200 mL 0  . TRELEGY ELLIPTA  100-62.5-25 mcg DsDv     . VITAMIN D3 ORAL Take by mouth    . buPROPion (WELLBUTRIN XL) 300 MG XL tablet TAKE 1 TABLET(300 MG) BY MOUTH DAILY 90 tablet 1  . meloxicam (MOBIC) 15 MG tablet Take 1  tablet (15 mg total) by mouth once daily 30 tablet 3  . multivitamin capsule Take 1 capsule by mouth once daily With tums sometimes for extra calcium     No facility-administered medications prior to visit.    Social Drivers of Health with Concerns   Tobacco Use: Medium Risk (10/31/2023)   Patient History   . Smoking Tobacco Use: Former   . Smokeless Tobacco Use: Never  Food Insecurity: Food Insecurity Present (10/31/2023)   Hunger Vital Sign   . Worried About Programme researcher, broadcasting/film/video in the Last Year: Sometimes true   . Ran Out of Food in  the Last Year: Sometimes true      10/31/2023  [facility] Authorization for Release of Information - [employer]   Are any of your needs urgent? No  Would you like help with any of the needs that you have identified? No  Permission to provide our community partners with your name, address, and phone number so they can contact you Decline    Social History   Tobacco Use  . Smoking status: Former    Current packs/day: 0.00    Average packs/day: 1.5 packs/day for 25.0 years (37.5 ttl pk-yrs)    Types: Cigarettes    Start date: 06/17/1983    Quit date: 06/16/2008    Years since quitting: 15.3  . Smokeless tobacco: Never  Substance Use Topics  . Alcohol use: Yes    Types: 6 Cans of beer per week    Comment: social  . Drug use: No   Other Health Habits: Exercise: walking a lot at work Diet: balanced, veggies.  Dental Exam: no tup to date Eye Exam: up to date  GYN: Sexual Health LMP: No LMP recorded (lmp unknown). Patient is postmenopausal. Patient has no period. Menopausal or had hysterectomy Last pap smear: see HM section History of abnormal pap smears: No  Health Maintenance: See under health Maintenance activity for review of completion dates as well. Immunization History  Administered Date(s) Administered  . COVID-19 Moderna Vaccine (1st,2nd,3rd dose = 0.1ml) 06/18/2019, 07/20/2019  . Influenza IIV4, IM PF (6 mo+) (FLULAVAL/FLUZONE/FLUARIX QUAD) 03/12/2017, 12/25/2020, 03/05/2022  . Influenza IIV4, IM pres-free 01/18/2015, 12/13/2015, 01/19/2018  . Influenza IIV4, cell derived (Egg-Free) PF (6 mo+) (Flucelvax QUAD) 01/26/2019  . Influenza TIV (IM) 01/22/2011, 01/08/2012, 12/28/2012  . PNEUMOCOCCAL (PPSV23)(>=44YRS -OR- >=2 YRS WITH RISK) VACCINE (PNEUMOVAX 23) 01/17/2010  . Pneumococcal (PCV20) (>=6WKS) vaccine (Prevnar 20) (aka PCV 20) 12/25/2020  . TDAP (>=44YR) VACCINE (ADACEL/BOOSTRIX) 12/13/2015    Health Maintenance Topics with due status: Overdue     Topic Date Due    Shingrix Never done   COVID-19 Vaccine 12/08/2022   RSV Immunization Pregnant or 60+ Never done   TSH Level 06/24/2023   Annual Physical/Well Child Check 06/25/2023   Colorectal Cancer Screening 07/16/2023   Health Maintenance Topics with due status: Not Due     Topic Last Completion Date   Adult Tetanus (Td And Tdap) 12/13/2015   Pap Smear 06/11/2019   Influenza Vaccine 03/05/2022   Lipid Panel 06/24/2022   Potassium Level 01/22/2023   Serum Calcium 01/22/2023   Diabetes Screening 01/22/2023   Mammogram 03/14/2023   Depression Screening 10/30/2023   Health Maintenance Topics with due status: Completed     Topic Last Completion Date   Hepatitis C Screen 09/16/2013   Pneumococcal Vaccine: 50+ 12/25/2020   Health Maintenance Topics with due status: Addressed     Topic Date Due   HIV Screen Addressed  Health Maintenance Topics with due status: Aged Out     Topic Date Due   Hib Vaccines Aged Out   Hepatitis A Vaccines Aged Out   Meningococcal B Vaccine Aged Out   Meningococcal ACWY Vaccine Aged Out   HPV Vaccines Aged Out    Depression Screen-PHQ2/9 PHQ-2 PHQ-2 Over the last 2 weeks, how often have you been bothered by any of the following problems? Little interest or pleasure in doing things: (Patient-Rptd) Not at all Feeling down, depressed, or hopeless: (Patient-Rptd) Not at all Patient Health Questionnaire-2 Score: (Patient-Rptd) 0  PHQ-9 (if PHQ >=3)    PHQ-2 Interpretation Values between 0-3 are considered not significant for depression  PHQ-9 Interpretation and Treatment Recommendations:  0-4= None  5-9= Mild / Treatment: Support, educate to call if worse; return in one month  10-14= Moderate / Treatment: Support, watchful waiting; Antidepressant or Psychotherapy  15-19= Moderately severe / Treatment: Antidepressant OR Psychotherapy  >= 20 = Major depression, severe / Antidepressant AND Psychotherapy  Review of Systems  HENT:  Negative for ear pain  and hearing loss.   Eyes:  Negative for blurred vision and double vision.  Respiratory:  Negative for shortness of breath.   Cardiovascular:  Negative for chest pain, palpitations and leg swelling.  Gastrointestinal:  Negative for blood in stool, constipation and diarrhea.  Musculoskeletal:  Negative for falls.  Neurological:  Negative for sensory change.  Psychiatric/Behavioral:  Negative for depression.      Objective:   Vitals:   10/31/23 1429  BP: 119/68  Pulse: 71  Weight: 73.1 kg (161 lb 3.2 oz)  Height: 164.5 cm (5' 4.76)  PainSc: 0-No pain   Body mass index is 27.02 kg/m. Home vitals:     Physical Exam  Physical Exam  Constitutional: alert and in NAD Conversation: normal and appropriate HEENT: EOMI, external ear canals normal, tympanic membranes normal, and pupils equal and round Skin: normal Oropharynx: mucous membranes moist Neck: supple and no thyroid enlargement or cervical adenopathy Respiratory: clear to auscultation, without rales or wheezes Cardiovascular: regular rate and rhythm and without murmurs, rubs or gallops Abdomen: soft, nontender, nondistended, and no hepatosplenomegaly Musculoskeletal: age appropriate ROM of shoulders, hips and knees Extremities: no lower extremity edema, dorsalis pedis pulses normal Neurological: grossly intact and normal gait  PE Chaperone note:  Breast Exam: Breast exam was deferred Pelvic Exam: Pelvic exam was deferred  Results   Assessment/Plan:   Patient was seen for a health maintenance exam.  Counseled the patient on health maintenance issues. Reviewed her health maintenance schedule and ordered appropriate tests. Counseled on regular exercise and weight management. Recommend regular eye exams and dental cleaning.   The following issues were addressed today for health maintenance: Colon cancer screening options reviewed Diet - reviewed healthy diet and weight management Discussed importance of aerobic  exercise and strength training Dermatologist skin exam recommended Encouraged to increase or continue intake of vitamin D (goal 1000-2000 Int Units/day) Encouraged to schedule routine dental follow up Encouraged to schedule routine eye check Follow up for annual exam in one year Women's Health--mammogram recommended Patient declines these recommended vaccines today: shingrix, RSV  The patient's BMI is in the acceptable range  Assessment & Plan Chronic pain of multiple joints Significant improvement in shoulder pain following a cortisone injection in March. Has not used meloxicam due to pain relief from the injection. Future cortisone injections discussed, with a minimum interval of three months. Informed that injections can be administered every six months, but more  frequent injections require orthopedic consultation. - Refill meloxicam prescription - Consider future cortisone injections if pain recurs, with a minimum interval of three months - Consult orthopedics if more frequent injections are needed  Essential hypertension Blood pressure well-controlled with losartan. Advised to monitor for symptoms of hypotension, especially with ongoing weight loss, as medication dosage may need adjustment. Potential to reduce or discontinue losartan if weight loss continues and blood pressure remains low. - Monitor blood pressure regularly - Report symptoms of hypotension such as dizziness or fatigue  Hyponatremia Chronic low sodium levels for 13 years. High water intake may contribute to dilutional hyponatremia. Advised to consider electrolyte supplementation. - Recheck sodium levels - Consider adding electrolyte powder to water intake  Asthma without status asthmaticus Emphysema Uses inhalers as prescribed and has a follow-up appointment with a pulmonologist next month. No changes in inhaler prescriptions needed. - Continue current inhaler regimen - Follow up with pulmonologist next  month  Overweight Weight decreased from 168 lbs in March to 161 lbs currently. Attributes weight loss to dietary changes, particularly increased consumption of salads and cold foods due to hot weather. Encouraged to continue healthy eating habits. - Continue current dietary habits focusing on salads and cold foods  General Health Maintenance Routine health maintenance discussed, including dental and eye care, mammogram, Pap smear, and colon cancer screening. Due for a Pap smear next year and a mammogram in December. Colon cancer screening with a stool kit is due annually. Discussed RSV and shingles vaccines, but she declined. - Order blood work including metabolic panel and thyroid function tests - Provide list of local dentists - Recommend [facility] or [facility] for eye care - Order annual colon cancer screening with stool kit - Schedule mammogram in December - Schedule Pap smear next year  Diagnoses and all orders for this visit:  Essential hypertension -     Lipid Panel W/Reflex Direct Low Density Lipoprotein (LDL) Cholesterol; Future -     Comprehensive Metabolic Panel (CMP); Future  Routine general medical examination at health care facility  Depression screening (Z13.31) -     Depression Screen -(PHQ- 2/9, BDI)  Overweight -     buPROPion (WELLBUTRIN XL) 300 MG XL tablet; Take 1 tablet (300 mg total) by mouth once daily  Chronic pain of multiple joints -     meloxicam (MOBIC) 15 MG tablet; Take 1 tablet (15 mg total) by mouth once daily  Encounter for behavioral health screening (Z13.30) -     Anxiety Screen [NUR6106]  Subclinical hypothyroidism -     Thyroid Profile (TSH and T4 Free); Future  Screening for colon cancer -     Occult Blood CRC (Colorectal Cancer) Screening, Stool FIT; Future  Chronic hyponatremia     This visit was coded based on medical decision making (MDM).   Future Appointments     Date/Time Provider Department Center Visit Type    11/02/2024 2:00 PM (Arrive by 1:45 PM) Johnie Damien Dragon, PA [facility] PHYSICAL       An after visit summary was provided for the patient either in written format or through [facility].  This note has been created using automated tools and reviewed for accuracy by [facility]  ANNE FRANKLIN.

## 2023-12-04 ENCOUNTER — Ambulatory Visit: Payer: PRIVATE HEALTH INSURANCE | Admitting: Nurse Practitioner

## 2023-12-09 ENCOUNTER — Ambulatory Visit: Payer: PRIVATE HEALTH INSURANCE | Admitting: Internal Medicine

## 2023-12-09 ENCOUNTER — Telehealth: Payer: Self-pay

## 2023-12-09 ENCOUNTER — Encounter: Payer: Self-pay | Admitting: Internal Medicine

## 2023-12-09 VITALS — BP 143/74 | HR 85 | Temp 98.6°F | Resp 16 | Ht 66.0 in | Wt 162.0 lb

## 2023-12-09 DIAGNOSIS — R0602 Shortness of breath: Secondary | ICD-10-CM | POA: Diagnosis not present

## 2023-12-09 DIAGNOSIS — J449 Chronic obstructive pulmonary disease, unspecified: Secondary | ICD-10-CM | POA: Diagnosis not present

## 2023-12-09 MED ORDER — YUPELRI 175 MCG/3ML IN SOLN: 175.0000 ug | Freq: Every day | RESPIRATORY_TRACT | 5 refills | Status: DC

## 2023-12-09 NOTE — Addendum Note (Signed)
 Addended by: ALMER BI on: 12/09/2023 04:19 PM   Modules accepted: Orders

## 2023-12-09 NOTE — Progress Notes (Signed)
 [facility] [address] [place], [place] [address]  Pulmonary Sleep Medicine   Office Visit Note  Patient Name: Raven Ayers DOB: [birth year] MRN [id number]  Date of Service: 12/09/2023  Complaints/HPI: She has noted that she is coughing more than normal. Occasional sputum production is noted. No chest pain is noted. No fevers or chills. Denies having admissions to the hospital. She had a spiro done which shows FEV1 at 59% from a percentage perspsctive she is better but absoulte value is the same  Office Spirometry Results:     ROS  General: (-) fever, (-) chills, (-) night sweats, (-) weakness Skin: (-) rashes, (-) itching,. Eyes: (-) visual changes, (-) redness, (-) itching. Nose and Sinuses: (-) nasal stuffiness or itchiness, (-) postnasal drip, (-) nosebleeds, (-) sinus trouble. Mouth and Throat: (-) sore throat, (-) hoarseness. Neck: (-) swollen glands, (-) enlarged thyroid, (-) neck pain. Respiratory: + cough, (-) bloody sputum, - shortness of breath, - wheezing. Cardiovascular: - ankle swelling, (-) chest pain. Lymphatic: (-) lymph node enlargement. Neurologic: (-) numbness, (-) tingling. Psychiatric: (-) anxiety, (-) depression   Current Medication: Outpatient Encounter Medications as of 12/09/2023  Medication Sig   buPROPion (WELLBUTRIN XL) 300 MG 24 hr tablet Take by mouth.   Fluticasone -Umeclidin-Vilant (TRELEGY ELLIPTA ) 100-62.5-25 MCG/ACT AEPB Inhale 1 puff into the lungs daily.   losartan (COZAAR) 50 MG tablet Take 1 tablet by mouth daily.   Multiple Vitamin (MULTIVITAMIN) capsule Take by mouth.   nystatin (MYCOSTATIN) 100000 UNIT/ML suspension SWISH AND SWALLOW 5 ML FOUR TIMES DAILY FOR 10 DAYS as needed   predniSONE  (STERAPRED UNI-PAK 21 TAB) 10 MG (21) TBPK tablet Use as directed for 6 days   VENTOLIN  HFA 108 (90 Base) MCG/ACT inhaler Inhale 2 puffs into the lungs every 6 (six) hours as needed for wheezing or shortness of breath.   No  facility-administered encounter medications on file as of 12/09/2023.    Surgical History: Past Surgical History:  Procedure Laterality Date   FOOT SURGERY Left     Medical History: Past Medical History:  Diagnosis Date   Asthma     Family History: Family History  Problem Relation Age of Onset   Cancer Mother    Alzheimer's disease Father     Social History: Social History   Socioeconomic History   Marital status: Widowed    Spouse name: Not on file   Number of children: Not on file   Years of education: Not on file   Highest education level: Not on file  Occupational History   Not on file  Tobacco Use   Smoking status: Former    Types: Cigarettes   Smokeless tobacco: Never   Tobacco comments:    10 years pt quit  Vaping Use   Vaping status: Never Used  Substance and Sexual Activity   Alcohol use: Yes    Comment: social   Drug use: Never   Sexual activity: Not on file  Other Topics Concern   Not on file  Social History Narrative   Not on file   Social Drivers of Health   Financial Resource Strain: Low Risk  (10/31/2023)   Received from [facility]   Overall Financial Resource Strain (CARDIA)    Difficulty of Paying Living Expenses: Not very hard  Food Insecurity: Food Insecurity Present (10/31/2023)   Received from [facility]   Hunger Vital Sign    Within the past 12 months, you worried that your food  would run out before you got the money to buy more.: Sometimes true    Within the past 12 months, the food you bought just didn't last and you didn't have money to get more.: Sometimes true  Transportation Needs: No Transportation Needs (10/31/2023)   Received from [facility] - Transportation    In the past 12 months, has lack of transportation kept you from medical appointments or from getting medications?: No    Lack of Transportation (Non-Medical): No  Physical Activity: Not on file   Stress: Not on file  Social Connections: Not on file  Intimate Partner Violence: Not on file    Vital Signs: Blood pressure (!) 143/74, pulse 85, temperature 98.6 F (37 C), resp. rate 16, height 5' 6 (1.676 m), weight 162 lb (73.5 kg), SpO2 97%, unknown if currently breastfeeding.  Examination: General Appearance: The patient is well-developed, well-nourished, and in no distress. Skin: Gross inspection of skin unremarkable. Head: normocephalic, no gross deformities. Eyes: no gross deformities noted. ENT: ears appear grossly normal no exudates. Neck: Supple. No thyromegaly. No LAD. Respiratory: no rhonchi noted at this time. Cardiovascular: Normal S1 and S2 without murmur or rub. Extremities: No cyanosis. pulses are equal. Neurologic: Alert and oriented. No involuntary movements.  LABS: No results found for this or any previous visit (from the past 2160 hours).  Radiology: DG Foot Complete Right Result Date: 05/06/2017 CLINICAL DATA:  Follow-up foot fracture EXAM: RIGHT FOOT COMPLETE - 3+ VIEW COMPARISON:  None. FINDINGS: Healing fracture at the base of the 5th metatarsal. Fracture lucency remains minimally visible. Hallux valgus deformity with degenerative changes of the 1st MTP joint. Visualized soft tissues are within normal limits. IMPRESSION: Healing fracture at the base of the 5th metatarsal. Fracture lucency remains minimally visible. Electronically Signed   By: Pinkie Pebbles M.D.   On: 05/06/2017 17:09    No results found.  No results found.  Assessment and Plan: Patient Active Problem List   Diagnosis Date Noted   Emphysema of lung (HCC) 12/03/2022   Chronic obstructive pulmonary disease (HCC) 04/08/2020   Essential hypertension 04/08/2020    1. Chronic obstructive pulmonary disease, unspecified COPD type (HCC) (Primary) She is on trelegy and she states this is working well for her. She has not been on oxygen and states does not feel a need.  2. Shortness  of breath At baseline. She needs to continue using the trelegy as prescribed   General Counseling: I have discussed the findings of the evaluation and examination with Raven Ayers.  I have also discussed any further diagnostic evaluation thatmay be needed or ordered today. Raven Ayers verbalizes understanding of the findings of todays visit. We also reviewed her medications today and discussed drug interactions and side effects including but not limited excessive drowsiness and altered mental states. We also discussed that there is always a risk not just to her but also people around her. she has been encouraged to call the office with any questions or concerns that should arise related to todays visit.  Orders Placed This Encounter  Procedures   For home use only DME Nebulizer machine    Patient needs a nebulizer to treat with the following condition:   Obstructive chronic bronchitis without exacerbation (HCC) [491.20.ICD-9-CM]    Length of Need:   Lifetime    Additional equipment included:   Administration kit     Time spent: 45  I have personally obtained a history, examined the patient, evaluated laboratory and imaging  results, formulated the assessment and plan and placed orders.    Raven DELENA Bathe, MD [facility] Sleep medicine

## 2023-12-09 NOTE — Telephone Encounter (Signed)
 Pt advised that as per dr fernand advised her to hold trelegy and continue yupelri 

## 2023-12-10 ENCOUNTER — Telehealth: Payer: Self-pay

## 2023-12-10 NOTE — Telephone Encounter (Signed)
 Sent message to [facility] for nebulizer order

## 2023-12-11 ENCOUNTER — Telehealth: Payer: Self-pay

## 2023-12-11 NOTE — Telephone Encounter (Signed)
 Sent  message to [facility] waiting for response for nebulizer

## 2023-12-12 ENCOUNTER — Telehealth: Payer: Self-pay

## 2023-12-12 MED ORDER — NEBULIZER/TUBING/MOUTHPIECE KIT
PACK | 0 refills | Status: AC
Start: 2023-12-12 — End: ?

## 2023-12-12 NOTE — Telephone Encounter (Signed)
 Sent nebulizer to [facility]

## 2023-12-12 NOTE — Telephone Encounter (Signed)
 As per [facility] and [facility] they are taking pt insurance advised pt to call her insurance and see who is taking her insurance for nebulizer machine or second option is [facility]

## 2023-12-15 ENCOUNTER — Other Ambulatory Visit: Payer: Self-pay | Admitting: Nurse Practitioner

## 2023-12-15 DIAGNOSIS — R0602 Shortness of breath: Secondary | ICD-10-CM

## 2023-12-25 ENCOUNTER — Other Ambulatory Visit: Payer: Self-pay | Admitting: Nurse Practitioner

## 2023-12-25 DIAGNOSIS — J449 Chronic obstructive pulmonary disease, unspecified: Secondary | ICD-10-CM

## 2023-12-29 ENCOUNTER — Other Ambulatory Visit: Payer: Self-pay

## 2023-12-29 NOTE — Telephone Encounter (Signed)
 Pt called [facility] she having shortness breath as per alyssa advised her to stopped and ok to go back on trelegy

## 2024-06-08 ENCOUNTER — Ambulatory Visit: Payer: PRIVATE HEALTH INSURANCE | Admitting: Internal Medicine
